# Patient Record
Sex: Male | Born: 1980 | Race: White | Hispanic: No | Marital: Single | Smoking: Current every day smoker
Health system: Southern US, Community
[De-identification: ages and names within clinical notes are randomized; demographics above are authoritative.]

## PROBLEM LIST (undated history)

## (undated) DIAGNOSIS — N159 Renal tubulo-interstitial disease, unspecified: Secondary | ICD-10-CM

## (undated) DIAGNOSIS — Q676 Pectus excavatum: Secondary | ICD-10-CM

---

## 2003-08-16 ENCOUNTER — Emergency Department (HOSPITAL_COMMUNITY): Admission: EM | Admit: 2003-08-16 | Discharge: 2003-08-16 | Payer: Self-pay | Admitting: Emergency Medicine

## 2005-10-21 ENCOUNTER — Emergency Department (HOSPITAL_COMMUNITY): Admission: EM | Admit: 2005-10-21 | Discharge: 2005-10-22 | Payer: Self-pay | Admitting: Emergency Medicine

## 2005-10-28 ENCOUNTER — Emergency Department (HOSPITAL_COMMUNITY): Admission: EM | Admit: 2005-10-28 | Discharge: 2005-10-28 | Payer: Self-pay | Admitting: Emergency Medicine

## 2009-03-12 ENCOUNTER — Emergency Department (HOSPITAL_COMMUNITY): Admission: EM | Admit: 2009-03-12 | Discharge: 2009-03-12 | Payer: Self-pay | Admitting: Emergency Medicine

## 2012-02-08 ENCOUNTER — Encounter (HOSPITAL_COMMUNITY): Payer: Self-pay | Admitting: *Deleted

## 2012-02-08 ENCOUNTER — Emergency Department (HOSPITAL_COMMUNITY)
Admission: EM | Admit: 2012-02-08 | Discharge: 2012-02-08 | Disposition: A | Discharge status: Home / Self Care | Payer: Self-pay | Attending: Emergency Medicine | Admitting: Emergency Medicine

## 2012-02-08 DIAGNOSIS — F419 Anxiety disorder, unspecified: Secondary | ICD-10-CM

## 2012-02-08 DIAGNOSIS — F17200 Nicotine dependence, unspecified, uncomplicated: Secondary | ICD-10-CM | POA: Insufficient documentation

## 2012-02-08 DIAGNOSIS — N390 Urinary tract infection, site not specified: Secondary | ICD-10-CM

## 2012-02-08 DIAGNOSIS — M7918 Myalgia, other site: Secondary | ICD-10-CM

## 2012-02-08 DIAGNOSIS — F411 Generalized anxiety disorder: Secondary | ICD-10-CM | POA: Insufficient documentation

## 2012-02-08 DIAGNOSIS — IMO0001 Reserved for inherently not codable concepts without codable children: Secondary | ICD-10-CM | POA: Insufficient documentation

## 2012-02-08 DIAGNOSIS — R109 Unspecified abdominal pain: Secondary | ICD-10-CM

## 2012-02-08 LAB — URINE MICROSCOPIC-ADD ON

## 2012-02-08 LAB — URINALYSIS, ROUTINE W REFLEX MICROSCOPIC
Hgb urine dipstick: NEGATIVE
Specific Gravity, Urine: 1.036 — ABNORMAL HIGH (ref 1.005–1.030)
pH: 5.5 (ref 5.0–8.0)

## 2012-02-08 MED ORDER — CIPROFLOXACIN HCL 500 MG PO TABS: 500.0000 mg | ORAL_TABLET | Freq: Two times a day (BID) | ORAL | Status: DC

## 2012-02-08 MED ORDER — CYCLOBENZAPRINE HCL 5 MG PO TABS: 5.0000 mg | ORAL_TABLET | Freq: Three times a day (TID) | ORAL | Status: DC | PRN

## 2012-02-08 MED ORDER — PROMETHAZINE HCL 25 MG PO TABS: 25.0000 mg | ORAL_TABLET | Freq: Four times a day (QID) | ORAL | Status: DC | PRN

## 2012-02-08 NOTE — ED Notes (Signed)
[id number]> Expected date:<BR> Expected time:<BR> Means of arrival:<BR> Comments:<BR> Difficulty swallowing

## 2012-02-08 NOTE — ED Provider Notes (Signed)
History     CSN: [id number]  Arrival date & time 02/08/12  0747   First MD Initiated Contact with Patient 02/08/12 0901      Chief Complaint  Patient presents with  . Flank Pain    (Consider location/radiation/quality/duration/timing/severity/associated sxs/prior treatment) HPI  Patient reports about 5 weeks ago he helped a friend move and was picking up heavy boxes. He states about the same time he started having a pressure feeling in his left flank area. He states the pain is also sharp. He also states he's feeling knots in his back that he is extremely concerned about. He states turning to the left or laying flat on his back makes the pain worse, sitting up or putting pressure on the area makes it feel better as does tapping on it  and he indicates he takes his fist and beats on his left flank area. He denies urinary symptoms such as dysuria or frequency or hematuria. He also denies penile drip. He states he's had some nausea and vomiting off and on for the past 2 weeks. He denies any fever or chills. He denies having this problem before.  Patient also reports he has been experiencing anxiety for the past 2 years. He states he's been under a lot of stress and he "overanalyzes" everything. 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  He also states he's stressed over keeping his relationship intact with his girlfriend. He denies feeling depressed or having suicidal ideation. He has never been treated for anxiety in the past.   PCP none  History reviewed. No pertinent past medical history.  History reviewed. No pertinent past surgical history.  No family history on file.  History  Substance Use Topics  . Smoking status: Current Every Day Smoker -- 0.5 packs/day    Types: Cigarettes  . Smokeless tobacco: Not on file  . Alcohol Use: No   Self-employed doing car bodywork   Review of  Systems  All other systems reviewed and are negative.    Allergies  Review of patient's allergies indicates no known allergies.  Home Medications   Current Outpatient Rx  Name  Route  Sig  Dispense  Refill    BP 132/82  Pulse 104  Temp 98 F (36.7 C) (Oral)  Resp 18  SpO2 98%  Vital signs normal except tachycardia   Physical Exam  Nursing note and vitals reviewed. Constitutional: He is oriented to person, place, and time.  Non-toxic appearance. He does not appear ill. No distress.       Tall, thin, anxious  HENT:  Head: Normocephalic and atraumatic.  Right Ear: External ear normal.  Left Ear: External ear normal.  Nose: Nose normal. No mucosal edema or rhinorrhea.  Mouth/Throat: Oropharynx is clear and moist and mucous membranes are normal. No dental abscesses or uvula swelling.  Eyes: Conjunctivae normal and EOM are normal. Pupils are equal, round, and reactive to light.  Neck: Normal range of motion and full passive range of motion without pain. Neck supple.  Cardiovascular: Normal rate, regular rhythm and normal heart sounds.  Exam reveals no gallop and no friction rub.   No murmur heard. Pulmonary/Chest: Effort normal and breath sounds normal. No respiratory distress. He has no wheezes. He has no rhonchi. He has no rales. He exhibits no tenderness and no crepitus.  Abdominal: Soft. Normal appearance and bowel sounds are normal. He exhibits no distension. There  is no tenderness. There is no rebound and no guarding.  Musculoskeletal: Normal range of motion. He exhibits no edema and no tenderness.       Moves all extremities well.   Patient shows me the knots in his back which are actually his lumbar spine spinous processes and transverse processes. He is extremely anxious and worried about this, he was reassured several times that this was the bones of his spine however he is not convinced.  Patient has diffuse pain in his left flank area without pain to palpation. He  has good range of motion of his waist without apparent discomfort.  Neurological: He is alert and oriented to person, place, and time. He has normal strength. No cranial nerve deficit.  Skin: Skin is warm, dry and intact. No rash noted. No erythema. No pallor.  Psychiatric: His speech is normal and behavior is normal. His mood appears not anxious.       Appears anxious    ED Course  Procedures (including critical care time)  Pt will be referred to [facility] for his anxiety, he does not need inpatient psychiatric care at this time. Pt does not appear to be in a lot of pain at this time, he easily changes positions and is watching TV in NAD.   Results for orders placed during the hospital encounter of 02/08/12  URINALYSIS, ROUTINE W REFLEX MICROSCOPIC      Component Value Range   Color, Urine AMBER (*) YELLOW   APPearance CLEAR  CLEAR   Specific Gravity, Urine 1.036 (*) 1.005 - 1.030   pH 5.5  5.0 - 8.0   Glucose, UA NEGATIVE  NEGATIVE mg/dL   Hgb urine dipstick NEGATIVE  NEGATIVE   Bilirubin Urine SMALL (*) NEGATIVE   Ketones, ur TRACE (*) NEGATIVE mg/dL   Protein, ur NEGATIVE  NEGATIVE mg/dL   Urobilinogen, UA 1.0  0.0 - 1.0 mg/dL   Nitrite NEGATIVE  NEGATIVE   Leukocytes, UA SMALL (*) NEGATIVE  URINE MICROSCOPIC-ADD ON      Component Value Range   Squamous Epithelial / LPF RARE  RARE   WBC, UA 7-10  <3 WBC/hpf   RBC / HPF 0-2  <3 RBC/hpf   Bacteria, UA FEW (*) RARE   Urine-Other MUCOUS PRESENT     Laboratory interpretation all normal except pyuria    1. Left flank pain   2. UTI (lower urinary tract infection)   3. Musculoskeletal pain   4. Anxiety     New Prescriptions   CIPROFLOXACIN (CIPRO) 500 MG TABLET    Take 1 tablet (500 mg total) by mouth every 12 (twelve) hours.   CYCLOBENZAPRINE (FLEXERIL) 5 MG TABLET    Take 1 tablet (5 mg total) by mouth 3 (three) times daily as needed for muscle spasms.   PROMETHAZINE (PHENERGAN) 25 MG TABLET    Take 1 tablet (25 mg total)  by mouth every 6 (six) hours as needed for nausea.    Plan discharge  Devoria Albe, MD, FACEP   MDM          Ward Givens, MD 02/08/12 [phone]

## 2012-02-08 NOTE — ED Notes (Signed)
MD at bedside. 

## 2012-02-08 NOTE — ED Notes (Signed)
Pt reports intermittent L flank pain x 4 weeks-reports pain every morning.  States pain is relieved by "tapping on it."  Reports vomiting x 1 this am prior to coming in the ED.  Pt also reports hx of "bad anxiety" which is causing his neck to hurt

## 2012-02-09 LAB — URINE CULTURE

## 2012-05-03 ENCOUNTER — Emergency Department (HOSPITAL_COMMUNITY): Payer: Self-pay

## 2012-05-03 ENCOUNTER — Encounter (HOSPITAL_COMMUNITY): Payer: Self-pay | Admitting: *Deleted

## 2012-05-03 ENCOUNTER — Emergency Department (HOSPITAL_COMMUNITY)
Admission: EM | Admit: 2012-05-03 | Discharge: 2012-05-03 | Disposition: A | Discharge status: Home Health | Payer: Self-pay | Attending: Emergency Medicine | Admitting: Emergency Medicine

## 2012-05-03 DIAGNOSIS — R071 Chest pain on breathing: Secondary | ICD-10-CM | POA: Insufficient documentation

## 2012-05-03 DIAGNOSIS — R0789 Other chest pain: Secondary | ICD-10-CM

## 2012-05-03 DIAGNOSIS — R109 Unspecified abdominal pain: Secondary | ICD-10-CM

## 2012-05-03 DIAGNOSIS — R110 Nausea: Secondary | ICD-10-CM | POA: Insufficient documentation

## 2012-05-03 DIAGNOSIS — F17200 Nicotine dependence, unspecified, uncomplicated: Secondary | ICD-10-CM | POA: Insufficient documentation

## 2012-05-03 DIAGNOSIS — R1012 Left upper quadrant pain: Secondary | ICD-10-CM | POA: Insufficient documentation

## 2012-05-03 LAB — CBC WITH DIFFERENTIAL/PLATELET
Basophils Relative: 0 % (ref 0–1)
Eosinophils Absolute: 0.2 10*3/uL (ref 0.0–0.7)
Eosinophils Relative: 2 % (ref 0–5)
Hemoglobin: 15.4 g/dL (ref 13.0–17.0)
Lymphs Abs: 1.3 10*3/uL (ref 0.7–4.0)
MCH: 31.7 pg (ref 26.0–34.0)
MCHC: 35.2 g/dL (ref 30.0–36.0)
MCV: 89.9 fL (ref 78.0–100.0)
Monocytes Relative: 8 % (ref 3–12)
Neutro Abs: 6.7 10*3/uL (ref 1.7–7.7)
Platelets: 172 10*3/uL (ref 150–400)
RDW: 12.5 % (ref 11.5–15.5)

## 2012-05-03 LAB — COMPREHENSIVE METABOLIC PANEL
ALT: 25 U/L (ref 0–53)
AST: 26 U/L (ref 0–37)
Alkaline Phosphatase: 72 U/L (ref 39–117)
GFR calc non Af Amer: >90 mL/min (ref >90)
Total Bilirubin: 0.4 mg/dL (ref 0.3–1.2)

## 2012-05-03 LAB — URINALYSIS, ROUTINE W REFLEX MICROSCOPIC
Leukocytes, UA: NEGATIVE
Protein, ur: NEGATIVE mg/dL

## 2012-05-03 LAB — LIPASE, BLOOD: Lipase: 30 U/L (ref 11–59)

## 2012-05-03 MED ORDER — OXYCODONE-ACETAMINOPHEN 5-325 MG PO TABS
2.0000 | ORAL_TABLET | Freq: Once | ORAL | Status: AC
  Administered 2012-05-03: 2 via ORAL
  Filled 2012-05-03: qty 2

## 2012-05-03 MED ORDER — MELOXICAM 15 MG PO TABS: 15.0000 mg | ORAL_TABLET | Freq: Every day | ORAL | Status: DC

## 2012-05-03 NOTE — ED Provider Notes (Signed)
History     CSN: [id number]  Arrival date & time 05/03/12  4098   First MD Initiated Contact with Patient 05/03/12 [phone]      Chief Complaint  Patient presents with  . Flank Pain  . Abdominal Pain    (Consider location/radiation/quality/duration/timing/severity/associated sxs/prior treatment) HPI  32 year old male presents to the emergency department with a complaint of left flank, left chest and left upper quadrant abdominal pain.  Patient states it has been going on for approximately 6 months and he has been to the emergency department before for the same.  Patient states that over the past 3 weeks the pain has been worsening in intensity.  He's had some associated nausea but denies any vomiting or diarrhea.  Patient denies a history of kidney stone.  He states that he thinks his mother may have history of kidney stones.  Patient denies any urinary symptoms.  He denies any penile discharge. Patient denies any shortness of breath, pain that radiates into the left shoulder or jaw, any paresthesia of the upper extremities, or diaphoresis. He denies any symptoms of upper respiratory infection.   History reviewed. No pertinent past medical history.  History reviewed. No pertinent past surgical history.  No family history on file.  History  Substance Use Topics  . Smoking status: Current Every Day Smoker -- 0.50 packs/day    Types: Cigarettes  . Smokeless tobacco: Not on file  . Alcohol Use: No      Review of Systems  Constitutional: Negative for fever, chills, activity change, appetite change and unexpected weight change.  Respiratory: Negative for cough and shortness of breath.   Cardiovascular: Negative for chest pain and palpitations.  Gastrointestinal: Positive for abdominal pain (ruq). Negative for vomiting, diarrhea and constipation.  Genitourinary: Positive for flank pain. Negative for dysuria, urgency, frequency, discharge, penile swelling and penile pain.   Musculoskeletal: Negative for myalgias, joint swelling, arthralgias and gait problem.  Skin: Negative for rash.  Neurological: Negative for headaches.    Allergies  Review of patient's allergies indicates no known allergies.  Home Medications  No current outpatient prescriptions on file.  BP 126/91  Pulse 92  Temp(Src) 98 F (36.7 C)  Resp 16  SpO2 98%  Physical Exam  Nursing note and vitals reviewed. Constitutional: He appears well-developed and well-nourished. No distress.  Addendum male in no acute distress   HENT:  Head: Normocephalic and atraumatic.  Eyes: Conjunctivae are normal. No scleral icterus.  Neck: Normal range of motion. Neck supple.  Cardiovascular: Normal rate, regular rhythm and normal heart sounds.   Pulmonary/Chest: Effort normal and breath sounds normal. No respiratory distress.  Large indentation in the lower portion of the sternum consistent with pectus excavitum   Abdominal: Soft. There is tenderness (luq). Guarding: ruq to very deep palpation.  No CVA tenderness  Musculoskeletal: He exhibits no edema.  Neurological: He is alert.  Skin: Skin is warm and dry. He is not diaphoretic.    ED Course  Procedures (including critical care time)  Labs Reviewed  COMPREHENSIVE METABOLIC PANEL - Abnormal; Notable for the following:    Glucose, Bld 107 (*)    All other components within normal limits  URINALYSIS, ROUTINE W REFLEX MICROSCOPIC  CBC WITH DIFFERENTIAL  LIPASE, BLOOD   No results found.   No diagnosis found.    MDM  10:52 AM BP 126/91  Pulse 92  Temp(Src) 98 F (36.7 C)  Resp 16  SpO2 98% Patient with complaint of pain in theleft chest,  flank, and left upper quadrant of the abdomen.  He does have pectus excavatum.  And may be related to musculoskeletal issues arising from his chest wall deformity..will evaluate for kidney stone, pyelo, uti.  Low suspicion for MI, diverticuiltis, pna, . Work up pending.  Patient labs show mildly  elevated glucose.  I suspect acute phase reaction due to stress.  All else negative.  Patient is nontoxic, nonseptic appearing, in no apparent distress.  Patient's pain and other symptoms adequately managed in emergency department.    Labs,and vitals reviewed.  Patient does not meet the SIRS or Sepsis criteria. I do not feel that imaging is indicated at this time.  Patient's patin was reproducible with palpation of the muscular tissues.   On repeat exam patient does not have a surgical abdomin and there are nor peritoneal signs.  No indication of appendicitis, bowel obstruction, bowel perforation, cholecystitis, diverticulitis,.  Patient discharged home with symptomatic treatment and given strict instructions for follow-up with their primary care physician.  I have also discussed reasons to return immediately to the ER.  Patient expresses understanding and agrees with plan.      Arthor Captain, PA-C 05/05/12 [phone]

## 2012-05-03 NOTE — ED Notes (Signed)
Patient states left lower abdominal and flank pain x 2-3 weeks, 3 months for abdominal pain, patient states +n/v/d,

## 2012-05-07 NOTE — ED Provider Notes (Signed)
Medical screening examination/treatment/procedure(s) were performed by non-physician practitioner and as supervising physician I was immediately available for consultation/collaboration.   Michael Y. Ghim, MD 05/07/12 0030 

## 2012-07-31 ENCOUNTER — Emergency Department (HOSPITAL_COMMUNITY): Payer: No Typology Code available for payment source

## 2012-07-31 ENCOUNTER — Encounter (HOSPITAL_COMMUNITY): Payer: Self-pay

## 2012-07-31 ENCOUNTER — Emergency Department (HOSPITAL_COMMUNITY)
Admission: EM | Admit: 2012-07-31 | Discharge: 2012-07-31 | Disposition: A | Discharge status: Home / Self Care | Payer: No Typology Code available for payment source | Attending: Emergency Medicine | Admitting: Emergency Medicine

## 2012-07-31 DIAGNOSIS — IMO0002 Reserved for concepts with insufficient information to code with codable children: Secondary | ICD-10-CM | POA: Insufficient documentation

## 2012-07-31 DIAGNOSIS — T148XXA Other injury of unspecified body region, initial encounter: Secondary | ICD-10-CM

## 2012-07-31 DIAGNOSIS — F17200 Nicotine dependence, unspecified, uncomplicated: Secondary | ICD-10-CM | POA: Insufficient documentation

## 2012-07-31 DIAGNOSIS — Y92410 Unspecified street and highway as the place of occurrence of the external cause: Secondary | ICD-10-CM | POA: Insufficient documentation

## 2012-07-31 DIAGNOSIS — Z791 Long term (current) use of non-steroidal anti-inflammatories (NSAID): Secondary | ICD-10-CM | POA: Insufficient documentation

## 2012-07-31 DIAGNOSIS — Y9389 Activity, other specified: Secondary | ICD-10-CM | POA: Insufficient documentation

## 2012-07-31 DIAGNOSIS — S139XXA Sprain of joints and ligaments of unspecified parts of neck, initial encounter: Secondary | ICD-10-CM | POA: Insufficient documentation

## 2012-07-31 DIAGNOSIS — Z87448 Personal history of other diseases of urinary system: Secondary | ICD-10-CM | POA: Insufficient documentation

## 2012-07-31 HISTORY — DX: Renal tubulo-interstitial disease, unspecified: N15.9

## 2012-07-31 MED ORDER — HYDROCODONE-ACETAMINOPHEN 5-325 MG PO TABS
1.0000 | ORAL_TABLET | Freq: Once | ORAL | Status: AC
  Administered 2012-07-31: 1 via ORAL
  Filled 2012-07-31: qty 1

## 2012-07-31 MED ORDER — CYCLOBENZAPRINE HCL 5 MG PO TABS: 5.0000 mg | ORAL_TABLET | Freq: Three times a day (TID) | ORAL | Status: DC | PRN

## 2012-07-31 MED ORDER — NAPROXEN 500 MG PO TABS: 500.0000 mg | ORAL_TABLET | Freq: Two times a day (BID) | ORAL | Status: DC

## 2012-07-31 NOTE — ED Notes (Signed)
Patient transported to X-ray 
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
## 2012-07-31 NOTE — ED Notes (Signed)
Patient was educated not to drive, operate heavy machinery, or drink alcohol while taking narcotic medication.  
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
## 2013-03-10 ENCOUNTER — Encounter (HOSPITAL_COMMUNITY): Payer: Self-pay | Admitting: Emergency Medicine

## 2013-03-10 ENCOUNTER — Emergency Department (HOSPITAL_COMMUNITY)
Admission: EM | Admit: 2013-03-10 | Discharge: 2013-03-11 | Disposition: A | Discharge status: Home / Self Care | Payer: No Typology Code available for payment source | Attending: Emergency Medicine | Admitting: Emergency Medicine

## 2013-03-10 DIAGNOSIS — M545 Low back pain, unspecified: Secondary | ICD-10-CM | POA: Insufficient documentation

## 2013-03-10 DIAGNOSIS — Z87448 Personal history of other diseases of urinary system: Secondary | ICD-10-CM | POA: Insufficient documentation

## 2013-03-10 DIAGNOSIS — Q676 Pectus excavatum: Secondary | ICD-10-CM | POA: Insufficient documentation

## 2013-03-10 DIAGNOSIS — M549 Dorsalgia, unspecified: Secondary | ICD-10-CM

## 2013-03-10 DIAGNOSIS — F17200 Nicotine dependence, unspecified, uncomplicated: Secondary | ICD-10-CM | POA: Insufficient documentation

## 2013-03-10 DIAGNOSIS — R300 Dysuria: Secondary | ICD-10-CM | POA: Insufficient documentation

## 2013-03-10 LAB — URINALYSIS, ROUTINE W REFLEX MICROSCOPIC
BILIRUBIN URINE: NEGATIVE
GLUCOSE, UA: NEGATIVE mg/dL
Hgb urine dipstick: NEGATIVE
KETONES UR: NEGATIVE mg/dL
LEUKOCYTES UA: NEGATIVE
Nitrite: NEGATIVE
PROTEIN: NEGATIVE mg/dL
Specific Gravity, Urine: 1.027 (ref 1.005–1.030)
Urobilinogen, UA: 0.2 mg/dL (ref 0.0–1.0)
pH: 6 (ref 5.0–8.0)

## 2013-03-10 MED ORDER — HYDROCODONE-ACETAMINOPHEN 5-325 MG PO TABS
1.0000 | ORAL_TABLET | Freq: Once | ORAL | Status: AC
  Administered 2013-03-11: 1 via ORAL
  Filled 2013-03-10: qty 1

## 2013-03-10 NOTE — ED Notes (Signed)
Pt states he thinks he may have a kidney infection  Pt is c/o left flank pain that started 2 days ago  Pt is also c/o migraine headache

## 2013-03-10 NOTE — ED Provider Notes (Signed)
CSN: [id number]     Arrival date & time 03/10/13  2002 History   First MD Initiated Contact with Patient 03/10/13 2303     Chief Complaint  Patient presents with  . Flank Pain   (Consider location/radiation/quality/duration/timing/severity/associated sxs/prior Treatment) HPI Comments: DETRIC SCALISI is a 33 year-old male, presenting the Emergency Department with a chief complaint of Left flank pain for several months worsening over the past 4 days.  He reports sharp, constant, non-radiating pain. The patient reports aggravating factors are bending and movement.  He reports he has experienced dysuria for the past several days.  The patient denies testicular pain or swelling.  Denies scrotal swelling, denies penile discharge. He denies known exposure to STI, and reports using a condom with each sexual encounter.  The history is provided by the patient and medical records. No language interpreter was used.    Past Medical History  Diagnosis Date  . Renal infection    History reviewed. No pertinent past surgical history. History reviewed. No pertinent family history. History  Substance Use Topics  . Smoking status: Current Every Day Smoker -- 0.50 packs/day    Types: Cigarettes  . Smokeless tobacco: Not on file  . Alcohol Use: Yes     Comment: rare    Review of Systems  Constitutional: Negative for fever and chills.  Gastrointestinal: Negative for nausea, vomiting, abdominal pain, diarrhea, constipation and blood in stool.  Genitourinary: Positive for dysuria and flank pain. Negative for urgency, frequency, hematuria, decreased urine volume, discharge, penile swelling, scrotal swelling, difficulty urinating, genital sores, penile pain and testicular pain.  Musculoskeletal: Negative for back pain.    Allergies  Review of patient's allergies indicates no known allergies.  Home Medications   Current Outpatient Rx  Name  Route  Sig  Dispense  Refill  . cyclobenzaprine (FLEXERIL) 10  MG tablet   Oral   Take 1 tablet (10 mg total) by mouth at bedtime.   20 tablet   0   . HYDROcodone-acetaminophen (NORCO/VICODIN) 5-325 MG per tablet   Oral   Take 1 tablet by mouth every 4 (four) hours as needed. With meals   15 tablet   0    BP 118/75  Pulse 92  Temp(Src) 98.8 F (37.1 C) (Oral)  Resp 20  Ht 6\' 1"  (1.854 m)  Wt 150 lb (68.04 kg)  BMI 19.79 kg/m2  SpO2 97% Physical Exam  Nursing note and vitals reviewed. Constitutional: He is oriented to person, place, and time. He appears well-developed and well-nourished. No distress.  HENT:  Head: Normocephalic and atraumatic.  Eyes: No scleral icterus.  Neck: Neck supple.  Cardiovascular: Normal rate and regular rhythm.   Pulmonary/Chest: Effort normal and breath sounds normal. He has no wheezes. He has no rales.  pectus excavatum  Abdominal: Soft. Bowel sounds are normal. He exhibits no distension and no mass. There is no tenderness. There is no rebound, no guarding and no CVA tenderness.  Musculoskeletal:       Lumbar back: He exhibits tenderness. He exhibits no swelling and no edema.       Back:  No midline C-spine, T-spine, or L-spine tenderness with no step-offs, crepitus, or deformities noted.    Neurological: He is alert and oriented to person, place, and time.  Skin: Skin is dry. He is not diaphoretic.  Psychiatric: He has a normal mood and affect. His behavior is normal.    ED Course  Procedures (including critical care time) Labs Review Labs Reviewed  URINALYSIS, ROUTINE W REFLEX MICROSCOPIC - Abnormal; Notable for the following:    APPearance TURBID (*)    All other components within normal limits  URINE CULTURE  URINE MICROSCOPIC-ADD ON   Imaging Review No results found.  EKG Interpretation   None       MDM   1. Back pain   2. Dysuria    Pt with a 1 year history of left back/flank pain, worsened over past 4 days.  PE without CVA tenderness.  Likely Muscular strain. Complains of  dysuria, will evaluate for infection, GC/Chlamydia for possible STI.  Patient declines GC/Chlamydia at this time, advises follow up with PCP or health department.  Urine culture sent.  Discussed patient history, condition, and labs with Dr. Rhunette CroftNanavati who agrees the patient can be evaluated as an out-pt. Discussed lab results and treatment plan with the patient for likely muscle strain. Return precautions given. Reports understanding and no other concerns at this time.  Patient is stable for discharge at this time. Meds given in ED:  Medications  HYDROcodone-acetaminophen (NORCO/VICODIN) 5-325 MG per tablet 1 tablet (1 tablet Oral Given 03/11/13 0035)    New Prescriptions   CYCLOBENZAPRINE (FLEXERIL) 10 MG TABLET    Take 1 tablet (10 mg total) by mouth at bedtime.   HYDROCODONE-ACETAMINOPHEN (NORCO/VICODIN) 5-325 MG PER TABLET    Take 1 tablet by mouth every 4 (four) hours as needed. With meals      Clabe SealLauren M Hakeem Frazzini, PA-C 03/11/13 1610

## 2013-03-11 LAB — URINE MICROSCOPIC-ADD ON

## 2013-03-11 MED ORDER — CYCLOBENZAPRINE HCL 10 MG PO TABS: 10.0000 mg | ORAL_TABLET | Freq: Every day | ORAL | Status: DC

## 2013-03-11 MED ORDER — HYDROCODONE-ACETAMINOPHEN 5-325 MG PO TABS: 1.0000 | ORAL_TABLET | ORAL | Status: DC | PRN

## 2013-03-11 NOTE — Discharge Instructions (Signed)
Call for a follow up appointment with a Family or Primary Care Provider.  You can go to the health care department for STI testing. Return if Symptoms worsen.   Take medication as prescribed.   Emergency Department Resource Guide 1) Find a Doctor and Pay Out of Pocket Although you won't have to find out who is covered by your insurance plan, it is a good idea to ask around and get recommendations. You will then need to call the office and see if the doctor you have chosen will accept you as a new patient and what types of options they offer for patients who are self-pay. Some doctors offer discounts or will set up payment plans for their patients who do not have insurance, but you will need to ask so you aren't surprised when you get to your appointment.  2) Contact Your Local Health Department Not all health departments have doctors that can see patients for sick visits, but many do, so it is worth a call to see if yours does. If you don't know where your local health department is, you can check in your phone book. The CDC also has a tool to help you locate your state's health department, and many state websites also have listings of all of their local health departments.  3) Find a Walk-in Clinic If your illness is not likely to be very severe or complicated, you may want to try a walk in clinic. These are popping up all over the country in pharmacies, drugstores, and shopping centers. They're usually staffed by nurse practitioners or physician assistants that have been trained to treat common illnesses and complaints. They're usually fairly quick and inexpensive. However, if you have serious medical issues or chronic medical problems, these are probably not your best option.  No Primary Care Doctor: - Call Health Connect at  [phone] - they can help you locate a primary care doctor that  accepts your insurance, provides certain services, etc. - Physician Referral Service-  [phone]  Chronic Pain Problems: Organization         Address  Phone   Notes  [facility]  [phone] Patients need to be referred by their primary care doctor.   Medication Assistance: Organization         Address  Phone   Notes  [facility] [address] [place], [place] [address] [phone] --Must be a resident of [place] -- Must have NO insurance coverage whatsoever (no Medicaid/ Medicare, etc.) -- The pt. MUST have a primary care doctor that directs their care regularly and follows them in the community   [facility]ist  [phone]   [employer]  [phone]    Agencies that provide inexpensive medical care: Organization         Address  Phone   Notes  [facility]  [phone]) [phone]   [facility]    [phone]   [facility] [address] [place], [place] [address] [phone]   [facility] [address], [place] [phone]) [phone]   [facility]anned [facility]    [phone]   [facility]    [phone]   [facility]  [address], [place] Phone:  [phone], Fax:  [phone] Hours of Operation:  9 am - 6 pm, M-F.  Also accepts Medicaid/Medicare and self-pay.  [facility]  [address], [place] Phone: [phone], Fax: [phone]. Hours of Operation:  8:30 am - 5:30 pm, M-F.  Also accepts Medicaid and self-pay.  [facility] [place] [address], [place] Phone: [phone]   [place], [place], [place] [phone], Ext. [phone] Mondays & Thursdays: 7-9 AM.  First 15 patients are seen on a first come, first serve basis.    [place] Providers:  Organization         Address  Phone   Notes  [facility] [address],  [place] [phone] Also accepts self-pay patients.  [facility] [address], [place]  [phone]   [place], [address], [place] [phone]   [facility] [address] [place] [phone]   Lucianne Lei [address], [place]   [phone] Only accepts [place] Access [place] patients after they have their name applied to their card.   Self-Pay (no insurance) in [facility]:  Organization         Address  Phone   Notes  Sickle Cell Patients, [facility] [place] [phone]   [facility] [place] [phone]   [facility] [place]  [place], [place], [place] [phone]   [facility]/Dr. Osei-Bonsu  [address], [place] or [address], [place] [phone] Phone number for both [place] and [place] locations is the same.  [facility] [address], [place] [phone]   [facility] [address] [place] or [address] [phone]   [facility] [address], [place] [phone], phone; [phone], fax Sees patients 1st and 3rd Saturday of every month.  Must not qualify for public or private insurance (i.e. Medicaid, Medicare, [place] Health Choice, Veterans' Benefits)  Household income should be no more than 200% of the poverty level The clinic cannot treat you if you are pregnant or think you are pregnant  Sexually transmitted diseases are not treated at the clinic.    Dental Care: Organization         Address  Phone  Notes  [facility] Department of [facility] [phone] Accepts children up to age 29 who are enrolled in  [place] or [place]; pregnant women with a Medicaid card; and children who have applied for Medicaid or [place] Health Choice, but were declined, whose parents can pay a reduced fee at time of service.  [facility] of [facility]  [address], [place] [phone] Accepts children up to age 36 who are enrolled in [place] or [place]; pregnant women with a Medicaid card; and children who have applied for Medicaid or [place] Health Choice, but were declined, whose parents can pay a reduced fee at time of service.  [facility]  [place] [phone] Patients are seen by appointment only. Walk-ins are not accepted. [place] will see patients 66 years of age and older. Monday - Tuesday (8am-5pm) Most Wednesdays (8:30-5pm) $30 per visit, cash only  [facility]  [address], [facility] [phone] Patients are seen by appointment only. Walk-ins are not accepted. [place] will see patients 55 years of age and older. One Wednesday Evening (Monthly: Volunteer Based).  $30 per visit, cash only  [place]  [phone] for adults; Children under age 16, call Graduate Pediatric Dentistry at [phone]. Children aged 15-14, please call [phone] to request a pediatric application.  Dental services are provided in all areas of dental care including fillings, crowns and bridges, complete and partial dentures, implants, gum treatment, root canals, and extractions. Preventive care is also provided. Treatment is provided to both adults and children. Patients are selected via a lottery and there is often a waiting list.   [facility] [address] [place]  [phone] [email or web address]   [facility] [address] [place], [place] [phone], Ext. [phone] Second and Fourth Thursday of each month, opens at 6:30  AM; Clinic ends at 9 AM.  Patients are seen on a first-come first-served basis, and a limited number are seen during each clinic.   [facility]  [address] Hillard Danker [place], [place] [phone]   Eligibility Requirements You must have lived in [place], [place], or [place] counties for at least the last three months.   You cannot be eligible for state or federal sponsored [employer], including [employer], [place], or [employer].   You generally cannot be eligible for healthcare insurance through your employer.    How to apply: Eligibility screenings are held every Tuesday and Wednesday afternoon from 1:00 pm until 4:00 pm. You do not need an appointment for the interview!  [facility] [address] [place], [place]   [employer]  [place] Department  [place]  [phone]    Behavioral Health Resources in the Community: Intensive Outpatient Programs Organization         Address  Phone  Notes  [facility] [place]. [address], [place], [place] [phone]   [facility] [address], [place], [employer]   ADS: [facility] Drug S[facility] [address], [place], [place]   [place] [address]  [place], [place] or [phone]   Substance Abuse Resources Organization         Address  Phone  Notes  Alcohol and Drug Services  [phone]   [place]  [phone]   [facility] [place]   Chinita Pester  [phone]   Residential & Outpatient Substance Abuse Program  [phone]   Psychological Services Organization         Address  Phone  Notes  [facility] [place]  [place]  [phone]   [place] [address] [place] [phone] or  [phone]    Mobile Crisis Teams Organization         Address  Phone  Notes  [facility], Mobile Crisis Care Unit  [phone]   [facility]  [address] [place], [place]   [place] [address] [place] [phone]    Self-Help/Support Groups Organization         Address  Phone             Notes  [facility] [place] - variety of support groups  [place] Call for more information  Narcotics Anonymous (NA), [facility]aring [facility] [address], [employer]   2 meetings at this location   Special educational needs teacher         Address  Phone  Notes  [facility] [place],    [place]  [phone]   [facility]  [address] [place] [phone], [place], [place]   [place], [place] [phone] Admissions: 8am-3pm M-F  Incentives Substance [place] [address]    [place], [place] [phone]   [facility] [address] [place], [place], [place]   [facility] [address]  [place], [place]   [facility] - Intensive Outpatient [address], [place], [place]   [facility] ([place].,  [place], [place] [phone] or [phone]   [facility] ([facility]) [address], [place], [place] Accepts Medicaid  [facility] [address],  [place] [phone] Substance Abuse/Addiction Treatment   [facility] Organization         Address  Phone  Notes  [facility]  [phone]   Domenic Schwab, PhD [address] Arlis Porta [place], [place]   [phone] or [phone]   [place], [place] [phone]   [facility] [address]  [place], [place] [phone] Insurance/Medicaid/sponsorship through [facility] and Families [address], [address] [place]                                    [place], [place] [phone] [place] [address], [place] [phone]    Dr. Adele Schilder  [phone]   [facility] Dept. 1) [address], [place] 2) [place] 3)  [place] [address], [place] [phone]  [phone]   [place] [phone] or [phone] (After Hours)

## 2013-03-12 LAB — URINE CULTURE
CULTURE: NO GROWTH
Colony Count: NO GROWTH

## 2013-03-12 NOTE — ED Provider Notes (Signed)
Medical screening examination/treatment/procedure(s) were performed by non-physician practitioner and as supervising physician I was immediately available for consultation/collaboration.  EKG Interpretation   None        Derwood KaplanAnkit Donesha Wallander, MD 03/12/13 0050

## 2015-04-20 ENCOUNTER — Encounter (HOSPITAL_COMMUNITY): Payer: Self-pay | Admitting: Emergency Medicine

## 2015-04-20 ENCOUNTER — Emergency Department (HOSPITAL_COMMUNITY)
Admission: EM | Admit: 2015-04-20 | Discharge: 2015-04-20 | Disposition: A | Discharge status: Home / Self Care | Payer: No Typology Code available for payment source | Attending: Emergency Medicine | Admitting: Emergency Medicine

## 2015-04-20 DIAGNOSIS — Y998 Other external cause status: Secondary | ICD-10-CM | POA: Insufficient documentation

## 2015-04-20 DIAGNOSIS — S0502XA Injury of conjunctiva and corneal abrasion without foreign body, left eye, initial encounter: Secondary | ICD-10-CM | POA: Insufficient documentation

## 2015-04-20 DIAGNOSIS — Y9389 Activity, other specified: Secondary | ICD-10-CM | POA: Insufficient documentation

## 2015-04-20 DIAGNOSIS — Y9289 Other specified places as the place of occurrence of the external cause: Secondary | ICD-10-CM | POA: Insufficient documentation

## 2015-04-20 DIAGNOSIS — T2692XA Corrosion of left eye and adnexa, part unspecified, initial encounter: Secondary | ICD-10-CM

## 2015-04-20 DIAGNOSIS — X58XXXA Exposure to other specified factors, initial encounter: Secondary | ICD-10-CM | POA: Insufficient documentation

## 2015-04-20 DIAGNOSIS — F17210 Nicotine dependence, cigarettes, uncomplicated: Secondary | ICD-10-CM | POA: Insufficient documentation

## 2015-04-20 DIAGNOSIS — Z87448 Personal history of other diseases of urinary system: Secondary | ICD-10-CM | POA: Insufficient documentation

## 2015-04-20 MED ORDER — TETRACAINE HCL 0.5 % OP SOLN
1.0000 [drp] | Freq: Once | OPHTHALMIC | Status: AC
  Administered 2015-04-20: 1 [drp] via OPHTHALMIC
  Filled 2015-04-20: qty 2

## 2015-04-20 MED ORDER — TOBRAMYCIN 0.3 % OP SOLN
1.0000 [drp] | OPHTHALMIC | Status: DC
  Administered 2015-04-20: 1 [drp] via OPHTHALMIC
  Filled 2015-04-20: qty 5

## 2015-04-20 MED ORDER — ARTIFICIAL TEARS OP OINT
1.0000 "application " | TOPICAL_OINTMENT | OPHTHALMIC | Status: DC | PRN
  Administered 2015-04-20: 1 via OPHTHALMIC
  Filled 2015-04-20: qty 3.5

## 2015-04-20 MED ORDER — FLUORESCEIN SODIUM 1 MG OP STRP
1.0000 | ORAL_STRIP | Freq: Once | OPHTHALMIC | Status: AC
  Administered 2015-04-20: 1 via OPHTHALMIC
  Filled 2015-04-20: qty 1

## 2015-04-20 NOTE — ED Notes (Signed)
Pt presents to ED after getting transmission fluid in his left eye "three days ago".  Pt sts he has been waking up to his eye being caked closed, he's been having blurry vision, and some burning.

## 2015-04-20 NOTE — Discharge Instructions (Signed)
Apply the drops to the eye 1 drop 4 times a day for 1 week Apply the lubricant ointment to the eye every 3-4 hours as needed. Follow up withCorneal Abrasion The cornea is the clear covering at the front and center of the eye. When looking at the colored portion of the eye (iris), you are looking through the cornea. This very thin tissue is made up of many layers. The surface layer is a single layer of cells (corneal epithelium) and is one of the most sensitive tissues in the body. If a scratch or injury causes the corneal epithelium to come off, it is called a corneal abrasion. If the injury extends to the tissues below the epithelium, the condition is called a corneal ulcer. CAUSES   Scratches.  Trauma.  Foreign body in the eye. Some people have recurrences of abrasions in the area of the original injury even after it has healed (recurrent erosion syndrome). Recurrent erosion syndrome generally improves and goes away with time. SYMPTOMS   Eye pain.  Difficulty or inability to keep the injured eye open.  The eye becomes very sensitive to light.  Recurrent erosions tend to happen suddenly, first thing in the morning, usually after waking up and opening the eye. DIAGNOSIS  Your health care provider can diagnose a corneal abrasion during an eye exam. Dye is usually placed in the eye using a drop or a small paper strip moistened by your tears. When the eye is examined with a special light, the abrasion shows up clearly because of the dye. TREATMENT   Small abrasions may be treated with antibiotic drops or ointment alone.  A pressure patch may be put over the eye. If this is done, follow your doctor's instructions for when to remove the patch. Do not drive or use machines while the eye patch is on. Judging distances is hard to do with a patch on. If the abrasion becomes infected and spreads to the deeper tissues of the cornea, a corneal ulcer can result. This is serious because it can cause  corneal scarring. Corneal scars interfere with light passing through the cornea and cause a loss of vision in the involved eye. HOME CARE INSTRUCTIONS  Use medicine or ointment as directed. Only take over-the-counter or prescription medicines for pain, discomfort, or fever as directed by your health care provider.  Do not drive or operate machinery if your eye is patched. Your ability to judge distances is impaired.  If your health care provider has given you a follow-up appointment, it is very important to keep that appointment. Not keeping the appointment could result in a severe eye infection or permanent loss of vision. If there is any problem keeping the appointment, let your health care provider know. SEEK MEDICAL CARE IF:   You have pain, light sensitivity, and a scratchy feeling in one eye or both eyes.  Your pressure patch keeps loosening up, and you can blink your eye under the patch after treatment.  Any kind of discharge develops from the eye after treatment or if the lids stick together in the morning.  You have the same symptoms in the morning as you did with the original abrasion days, weeks, or months after the abrasion healed.   This information is not intended to replace advice given to you by your health care provider. Make sure you discuss any questions you have with your health care provider.   Document Released: 02/04/2000 Document Revised: 10/28/2014 Document Reviewed: 10/14/2012 Elsevier Interactive Patient Education  2016 Elsevier Inc.  Dr. Randon Goldsmith if your symptoms worsen or do not improve

## 2015-04-20 NOTE — ED Notes (Signed)
Pt stated he got transmission fluid in his left eye yesterday.  Slight redness noted.  No distress noted.

## 2015-04-20 NOTE — ED Provider Notes (Signed)
CSN: [id number]     Arrival date & time 04/20/15  1825 History  By signing my name below, I, Octavia Heir, attest that this documentation has been prepared under the direction and in the presence of Arthor Captain, PA-C. Electronically Signed: Octavia Heir, ED Scribe. 04/20/2015. 7:18 PM.    Chief Complaint  Patient presents with  . Eye Pain      The history is provided by the patient. No language interpreter was used.   HPI Comments: Mathew Byrd is a 35 y.o. male who presents to the Emergency Department complaining of a constant, gradual worsening left eye pain with associated eye dryness onset 3 days ago. He states today he began to have blurry vision for about one hour. Pt says he got transmission fluid in his eye about 3 days ago. After the incident occurred, pt reports his eye kept watering after cleaning it out. He reports that two days ago upon awakening, his eye was swollen and "stuck together" and has continued until today. Pt states his eye feels extremely dry and he when he puts the eyedrops it, it "soaks it up". He has been putting OTC lubricating eye drops in his eye to alleviate his symptoms with no relief. Pt denies any other symptoms. He does not wear contacts.    Past Medical History  Diagnosis Date  . Renal infection    History reviewed. No pertinent past surgical history. History reviewed. No pertinent family history. Social History  Substance Use Topics  . Smoking status: Current Every Day Smoker -- 0.50 packs/day    Types: Cigarettes  . Smokeless tobacco: None  . Alcohol Use: Yes     Comment: rare    Review of Systems  Eyes: Positive for pain, redness and visual disturbance.  All other systems reviewed and are negative.     Allergies  Review of patient's allergies indicates no known allergies.  Home Medications   Prior to Admission medications   Medication Sig Start Date End Date Taking? Authorizing Provider  cyclobenzaprine (FLEXERIL) 10 MG  tablet Take 1 tablet (10 mg total) by mouth at bedtime. 03/11/13   Mellody Drown, PA-C  HYDROcodone-acetaminophen (NORCO/VICODIN) 5-325 MG per tablet Take 1 tablet by mouth every 4 (four) hours as needed. With meals 03/11/13   Mellody Drown, PA-C   Triage vitals: BP 123/77 mmHg  Pulse 100  Temp(Src) 97.6 F (36.4 C) (Oral)  Resp 16  Ht  (1.854 m)  Wt 155 lb (70.308 kg)  BMI 20.45 kg/m2  SpO2 100% Physical Exam  Constitutional: He appears well-developed and well-nourished. No distress.  HENT:  Head: Normocephalic and atraumatic.  Eyes: Conjunctivae are normal. No scleral icterus.  Eye pressure: 15 on right, 17 on left; pH on both eyes are 7. Left eye is mildly injected, fluorescence uptake on conjunctiva on medial inferior region of the eye, no corneal involvement  Neck: Normal range of motion. Neck supple.  Cardiovascular: Normal rate, regular rhythm and normal heart sounds.   Pulmonary/Chest: Effort normal and breath sounds normal. No respiratory distress.  Abdominal: Soft. There is no tenderness.  Musculoskeletal: He exhibits no edema.  Neurological: He is alert.  Skin: Skin is warm and dry. He is not diaphoretic.  Psychiatric: His behavior is normal.  Nursing note and vitals reviewed.   ED Course  Procedures  DIAGNOSTIC STUDIES: Oxygen Saturation is 100% on RA, normal by my interpretation.  COORDINATION OF CARE:  7:00 PM Discussed treatment plan which includes numb and stain the  eye with pt at bedside and pt agreed to plan. Pt will be given antibiotic eye drops and eye ointment.  Labs Review Labs Reviewed - No data to display  Imaging Review No results found. I have personally reviewed and evaluated these images and lab results as part of my medical decision-making.   EKG Interpretation None      MDM   Final diagnoses:  Chemical injury of eye, left, initial encounter   Normal pH Corneal abrasion  Pt with corneal abrasion on PE. Eye irrigated w NS, no  evidence of FB.  No change in vision, acuity equal bilaterally.  Pt is not a contact lens wearer.  Exam non-concerning for orbital cellulitis, hyphema, corneal ulcers. Patient will be discharged home with erythromycin.   Patient understands to follow up with ophthalmology, & to return to ER if new symptoms develop including change in vision, purulent drainage, or entrapment. F/U with ophthalmology.  Medications  tetracaine (PONTOCAINE) 0.5 % ophthalmic solution 1 drop (1 drop Both Eyes Given 04/20/15 1902)  fluorescein ophthalmic strip 1 strip (1 strip Left Eye Given 04/20/15 1902)    I personally performed the services described in this documentation, which was scribed in my presence. The recorded information has been reviewed and is accurate.       Arthor Captain, PA-C 04/25/15 2003  Cathren Laine, MD 04/30/15 0730

## 2015-04-20 NOTE — ED Notes (Signed)
Patient able to ambulate independently  

## 2015-11-22 ENCOUNTER — Encounter (HOSPITAL_COMMUNITY): Payer: Self-pay | Admitting: Emergency Medicine

## 2015-11-22 ENCOUNTER — Emergency Department (HOSPITAL_COMMUNITY)
Admission: EM | Admit: 2015-11-22 | Discharge: 2015-11-23 | Disposition: A | Discharge status: Home / Self Care | Payer: Self-pay | Attending: Emergency Medicine | Admitting: Emergency Medicine

## 2015-11-22 DIAGNOSIS — F17210 Nicotine dependence, cigarettes, uncomplicated: Secondary | ICD-10-CM | POA: Insufficient documentation

## 2015-11-22 DIAGNOSIS — K644 Residual hemorrhoidal skin tags: Secondary | ICD-10-CM

## 2015-11-22 DIAGNOSIS — K921 Melena: Secondary | ICD-10-CM

## 2015-11-22 DIAGNOSIS — R109 Unspecified abdominal pain: Secondary | ICD-10-CM

## 2015-11-22 DIAGNOSIS — Z79899 Other long term (current) drug therapy: Secondary | ICD-10-CM | POA: Insufficient documentation

## 2015-11-22 LAB — COMPREHENSIVE METABOLIC PANEL
ALBUMIN: 4.9 g/dL (ref 3.5–5.0)
ALK PHOS: 70 U/L (ref 38–126)
ALT: 19 U/L (ref 17–63)
AST: 24 U/L (ref 15–41)
Anion gap: 6 (ref 5–15)
BILIRUBIN TOTAL: 1 mg/dL (ref 0.3–1.2)
BUN: 21 mg/dL — AB (ref 6–20)
CALCIUM: 9.9 mg/dL (ref 8.9–10.3)
CO2: 29 mmol/L (ref 22–32)
CREATININE: 0.82 mg/dL (ref 0.61–1.24)
Chloride: 104 mmol/L (ref 101–111)
GFR calc Af Amer: >60 mL/min (ref >60)
GFR calc non Af Amer: >60 mL/min (ref >60)
GLUCOSE: 97 mg/dL (ref 65–99)
Potassium: 4.5 mmol/L (ref 3.5–5.1)
Sodium: 139 mmol/L (ref 135–145)
TOTAL PROTEIN: 7.9 g/dL (ref 6.5–8.1)

## 2015-11-22 LAB — URINALYSIS, ROUTINE W REFLEX MICROSCOPIC
BILIRUBIN URINE: NEGATIVE
Glucose, UA: NEGATIVE mg/dL
Hgb urine dipstick: NEGATIVE
KETONES UR: NEGATIVE mg/dL
Leukocytes, UA: NEGATIVE
NITRITE: NEGATIVE
PH: 6 (ref 5.0–8.0)
Protein, ur: NEGATIVE mg/dL
Specific Gravity, Urine: 1.029 (ref 1.005–1.030)

## 2015-11-22 LAB — CBC
HCT: 47.2 % (ref 39.0–52.0)
Hemoglobin: 16.3 g/dL (ref 13.0–17.0)
MCH: 32.3 pg (ref 26.0–34.0)
MCHC: 34.5 g/dL (ref 30.0–36.0)
MCV: 93.5 fL (ref 78.0–100.0)
PLATELETS: 221 10*3/uL (ref 150–400)
RBC: 5.05 MIL/uL (ref 4.22–5.81)
RDW: 12.2 % (ref 11.5–15.5)
WBC: 8.9 10*3/uL (ref 4.0–10.5)

## 2015-11-22 LAB — LIPASE, BLOOD: Lipase: 20 U/L (ref 11–51)

## 2015-11-22 LAB — POC OCCULT BLOOD, ED: Fecal Occult Bld: POSITIVE — AB

## 2015-11-22 NOTE — ED Triage Notes (Signed)
Pt states that he has had abdominal pain x 1 week and has had intermittent light rectal bleeding x 1 year. Alert and oriented. Nausea.

## 2015-11-22 NOTE — ED Provider Notes (Signed)
[facility]-EMERGENCY DEPT Provider Note   CSN: [id number] Arrival date & time: 11/22/15  1849 By signing my name below, I, Mathew Byrd, attest that this documentation has been prepared under the direction and in the presence of non-physician practitioner, Antony Madura, PA-C Electronically Signed: Levon Byrd, Scribe. 11/22/2015. 10:34 PM.    History   Chief Complaint Chief Complaint  Patient presents with  . Abdominal Pain  . Rectal Bleeding   HPI Mathew Byrd is a 35 y.o. male who presents to the Emergency Department complaining of intermittent, sharp RUQ pain onset one week ago. 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 He notes worsening rectal bleeding which began one year ago, but has worsened in the last 2-3 months. He states the bleeding is heaviest when he is having abdominal pain. Blood is present in the stool and when wiping. Pt also notes associated nausea and generalized burning abdominal pain.  He states he has had multiple kidney infections in the past, but has never seen a urologist. Last infection was 3-4 years ago. Pt is not currently followed by a PCP. He endorses occasional alcohol use. He denies melena, fever, vomiting, or any urinary symptoms. No hx of abdominal surgeries.   The history is provided by the patient. No language interpreter was used.    Past Medical History:  Diagnosis Date  . Renal infection     There are no active problems to display for this patient.  History reviewed. No pertinent surgical history.   Home Medications    Prior to Admission medications   Medication Sig Start Date End Date Taking? Authorizing Provider  cyclobenzaprine (FLEXERIL) 10 MG tablet Take 1 tablet (10 mg total) by mouth at bedtime. Patient not taking: Reported on 11/22/2015 03/11/13   Mellody Drown, PA-C  HYDROcodone-acetaminophen (NORCO/VICODIN) 5-325 MG per tablet Take 1 tablet by mouth every 4 (four) hours as needed. With meals Patient not taking:  Reported on 11/22/2015 03/11/13   Mellody Drown, PA-C  omeprazole (PRILOSEC) 20 MG capsule Take 1 capsule (20 mg total) by mouth daily. 11/23/15   Antony Madura, PA-C    Family History History reviewed. No pertinent family history.  Social History Social History  Substance Use Topics  . Smoking status: Current Every Day Smoker    Packs/day: 0.50    Types: Cigarettes  . Smokeless tobacco: Not on file  . Alcohol use Yes     Comment: rare    Allergies   Review of patient's allergies indicates no known allergies.  Review of Systems Review of Systems 10 systems reviewed and all are negative for acute change except as noted in the HPI.   Physical Exam Updated Vital Signs BP 95/59   Pulse 60   Temp 98.2 F (36.8 C) (Oral)   Resp 18   SpO2 100%   Physical Exam  Constitutional: He is oriented to person, place, and time. He appears well-developed and well-nourished. No distress.  Nontoxic appearing and in no distress.  HENT:  Head: Normocephalic and atraumatic.  Eyes: Conjunctivae and EOM are normal. No scleral icterus.  Neck: Normal range of motion.  Cardiovascular: Normal rate, regular rhythm and intact distal pulses.   Pulmonary/Chest: Effort normal. No respiratory distress. He has no wheezes. He has no rales.  Respirations even and unlabored.  Lungs clear to auscultation bilaterally.  Abdominal: Soft. He exhibits no distension and no mass. There is tenderness. There is no guarding.  Soft, nondistended abdomen with TTP in the RUQ and mildly  in the epigastrium. Negative Murphy's sign. No peritoneal signs or masses. Normoactive bowel sounds.  Genitourinary:  Genitourinary Comments: Chaperoned DRE significant for external hemorrhoids.  No evidence of thrombosed hemorrhoids.  There is normal rectal tone.  No anal fissure.  Brown stool appreciated on exam.  There is no melena or hematochezia present.  Musculoskeletal: Normal range of motion.  No bilateral lower extremity edema.    Neurological: He is alert and oriented to person, place, and time. He exhibits normal muscle tone. Coordination normal.  Skin: Skin is warm and dry. No rash noted. He is not diaphoretic. No erythema. No pallor.  Psychiatric: He has a normal mood and affect. His behavior is normal.  Nursing note and vitals reviewed.   ED Treatments / Results  DIAGNOSTIC STUDIES:  Oxygen Saturation is 98% on RA, normal by my interpretation.    COORDINATION OF CARE:  11:02 PM  Discussed treatment plan with pt at bedside and pt agreed to plan.   Labs (all labs ordered are listed, but only abnormal results are displayed) Labs Reviewed  COMPREHENSIVE METABOLIC PANEL - Abnormal; Notable for the following:       Result Value   BUN 21 (*)    All other components within normal limits  POC OCCULT BLOOD, ED - Abnormal; Notable for the following:    Fecal Occult Bld POSITIVE (*)    All other components within normal limits  LIPASE, BLOOD  CBC  URINALYSIS, ROUTINE W REFLEX MICROSCOPIC (NOT AT [facility])    EKG  EKG Interpretation None      Radiology Ct Abdomen Pelvis W Contrast  Result Date: 11/23/2015 CLINICAL DATA:  Abdominal pain. Right upper quadrant pain for 1 week. Worsening rectal bleeding which began a year ago. Nausea. Multiple kidney infections. EXAM: CT ABDOMEN AND PELVIS WITH CONTRAST TECHNIQUE: Multidetector CT imaging of the abdomen and pelvis was performed using the standard protocol following bolus administration of intravenous contrast. CONTRAST:  ISOVUE-300 IOPAMIDOL (ISOVUE-300) INJECTION 61% COMPARISON:  None. FINDINGS: Lower chest: Pectus excavatum deformity.  Lung bases are clear. Hepatobiliary: No focal liver abnormality is seen. No gallstones, gallbladder wall thickening, or biliary dilatation. Pancreas: Unremarkable. No pancreatic ductal dilatation or surrounding inflammatory changes. Spleen: Normal in size without focal abnormality. Adrenals/Urinary Tract: Adrenal glands are  unremarkable. Kidneys are normal, without renal calculi, focal lesion, or hydronephrosis. Bladder is unremarkable. Stomach/Bowel: Stomach, small bowel, and colon are not abnormally distended. Scattered fluid and gas throughout the small bowel. Scattered stool throughout the colon. No discrete wall thickening is appreciated although under distention limits this evaluation. Vascular/Lymphatic: No significant vascular findings are present. No enlarged abdominal or pelvic lymph nodes. Filling defects are demonstrated in the common femoral and iliac veins bilaterally. This may be due to mixing of non-opacified blood but can't exclude venous thrombosis. Reproductive: Uterus and bilateral adnexa are unremarkable. Other: No abdominal wall hernia or abnormality. No abdominopelvic ascites. Musculoskeletal: No acute or significant osseous findings. IMPRESSION: No definite evidence for any acute process in the abdomen or pelvis. No evidence of bowel obstruction. Nonspecific filling defects in the common femoral and iliac veins may be due to mixing of non-opacified blood but can't exclude venous thrombosis. Electronically Signed   By: Burman Nieves M.D.   On: 11/23/2015 00:54    Procedures Procedures (including critical care time)  Medications Ordered in ED Medications  gi cocktail (Maalox,Lidocaine,Donnatal) (not administered)  iopamidol (ISOVUE-300) 61 % injection 100 mL (100 mLs Intravenous Contrast Given 11/23/15 0006)  Initial Impression / Assessment and Plan / ED Course  I have reviewed the triage vital signs and the nursing notes.  Pertinent labs & imaging results that were available during my care of the patient were reviewed by me and considered in my medical decision making (see chart for details).  Clinical Course    35 year old male presents to the emergency department for abdominal pain which has been fairly persistent over the past week.  He has not had any emesis associated with his  symptoms, though he does complain of some nausea.  Patient states that he has noted some hematochezia; however, this is not new for him, present for the past year.  He has no evidence of gross blood on digital rectal exam today.  There is no melena.  External hemorrhoids noted on chaperoned exam.  Patient is afebrile with reassuring laboratory workup.  He has no leukocytosis.  Liver and kidney function preserved.  Pancreas function is normal.  Given her reported tenderness and hematochezia, CT abdomen and pelvis obtained which shows no acute process in the abdomen or pelvis. No findings to suggest Crohn's colitis. Gallbladder unremarkable. There is question of filling defect vs thrombosis in the common femoral and iliac veins. Doubt thrombosis as patient with no lower extremity edema or know risk factors for this.  Given reassuring workup today, I believe the patient is appropriate for discharge.  He has been referred to a gastroenterologist for outpatient follow-up.  Plan is started on PPI as upper abdominal pain may be secondary to gastritis.  Return precautions discussed and provided.  Patient discharged in stable condition with no unaddressed concerns.   Final Clinical Impressions(s) / ED Diagnoses   Final diagnoses:  Abdominal pain, unspecified abdominal location  Hematochezia  External hemorrhoids    New Prescriptions New Prescriptions   OMEPRAZOLE (PRILOSEC) 20 MG CAPSULE    Take 1 capsule (20 mg total) by mouth daily.   I personally performed the services described in this documentation, which was scribed in my presence. The recorded information has been reviewed and is accurate.      Antony MaduraKelly Giovan Pinsky, PA-C 11/23/15 [id number]    Mancel BaleElliott Wentz, MD 11/23/15 2125

## 2015-11-23 ENCOUNTER — Emergency Department (HOSPITAL_COMMUNITY): Payer: Self-pay

## 2015-11-23 MED ORDER — GI COCKTAIL ~~LOC~~
30.0000 mL | Freq: Once | ORAL | Status: AC
  Administered 2015-11-23: 30 mL via ORAL
  Filled 2015-11-23: qty 30

## 2015-11-23 MED ORDER — IOPAMIDOL (ISOVUE-300) INJECTION 61%
100.0000 mL | Freq: Once | INTRAVENOUS | Status: AC | PRN
  Administered 2015-11-23: 100 mL via INTRAVENOUS

## 2015-11-23 MED ORDER — OMEPRAZOLE 20 MG PO CPDR: 20.0000 mg | DELAYED_RELEASE_CAPSULE | Freq: Every day | ORAL | 0 refills | Status: AC

## 2015-11-23 NOTE — ED Notes (Signed)
Patient transported to CT 

## 2017-09-19 IMAGING — CT CT ABD-PELV W/ CM
2 of 4 series · 15 of 46 positions shown, 17 images · IV contrast (iopamidol)
Comparison: None.

CLINICAL DATA: Abdominal pain. Right upper quadrant pain for 1
week. Worsening rectal bleeding which began a year ago. Nausea.
Multiple kidney infections.

EXAM:
CT ABDOMEN AND PELVIS WITH CONTRAST
TECHNIQUE: Multidetector CT imaging of the abdomen and pelvis was performed
using the standard protocol following bolus administration of
intravenous contrast.
CONTRAST:  100mL YIKM4W-AFF IOPAMIDOL (YIKM4W-AFF) INJECTION 61%

[Series 2: abd/pel with · axial · 0.74mm/px · z∈[+999,+1359]mm · 12 of 86 slices shown, 14 images]
[im 7/86  soft-tissue]
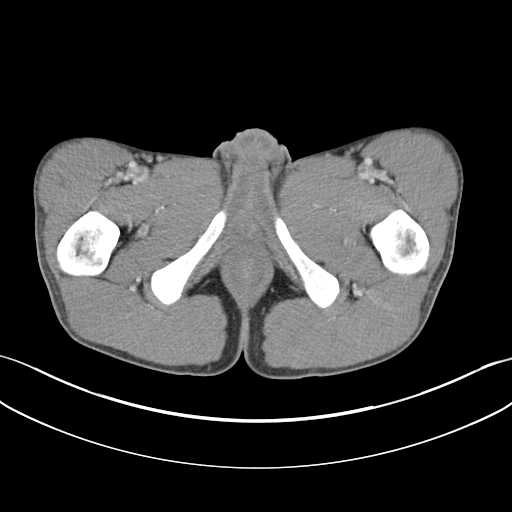
[im 7/86  bone]
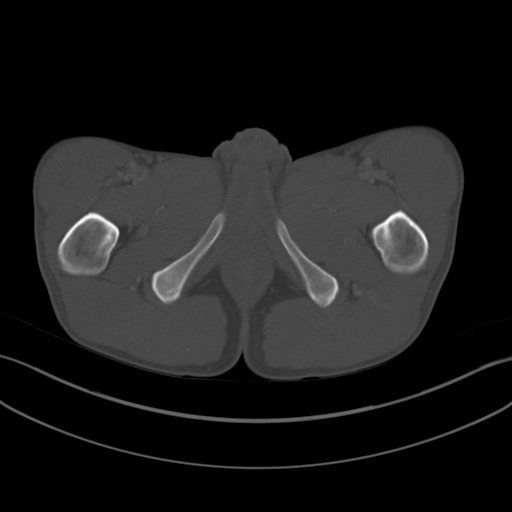
[im 13/86  soft-tissue]
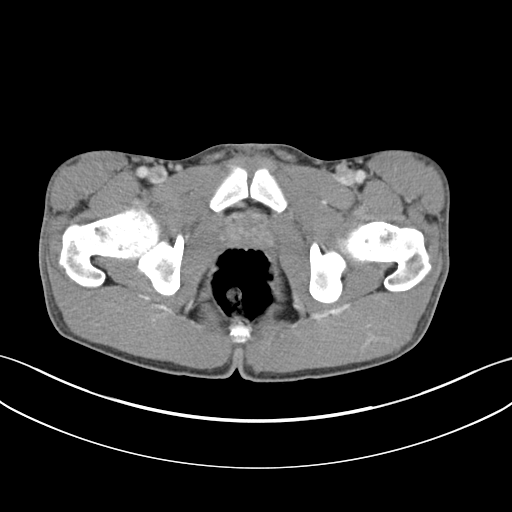
[im 19/86  soft-tissue]
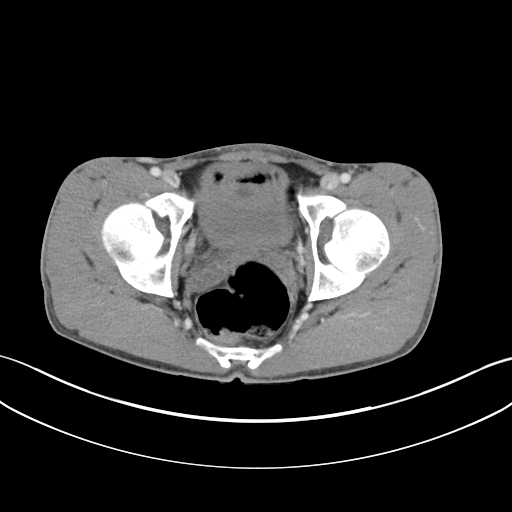
[im 25/86  soft-tissue]
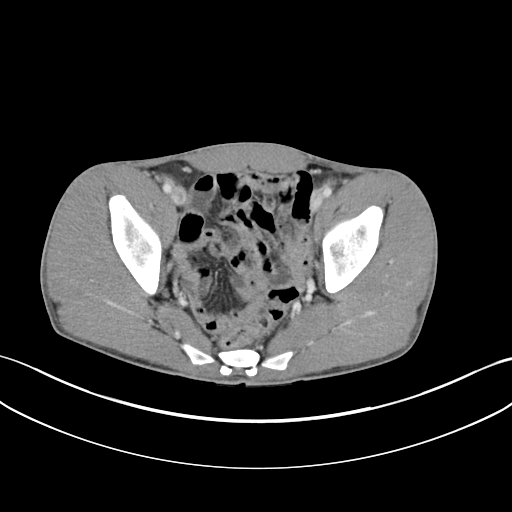
[im 31/86  soft-tissue]
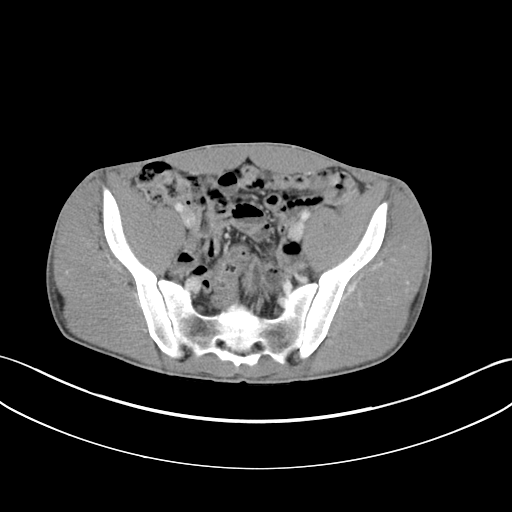
[im 37/86  soft-tissue]
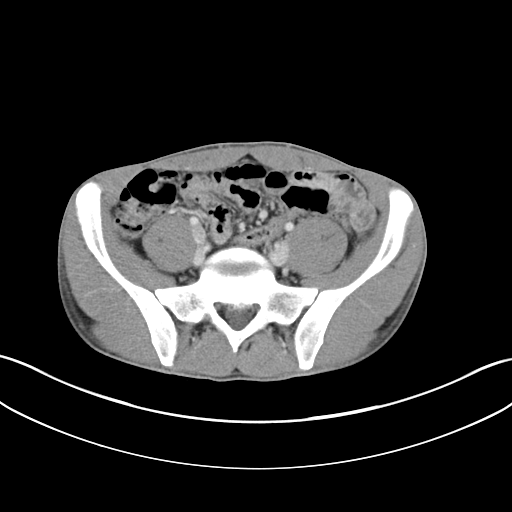
[im 49/86  soft-tissue]
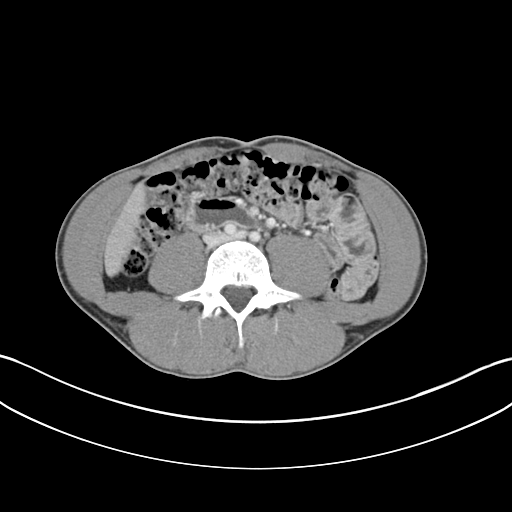
[im 55/86  soft-tissue]
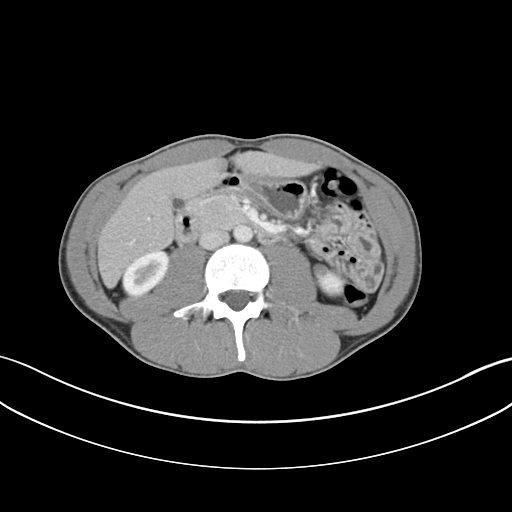
[im 61/86  soft-tissue]
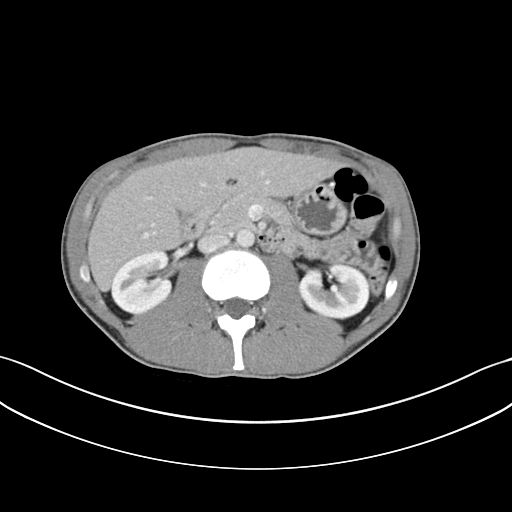
[im 61/86  bone]
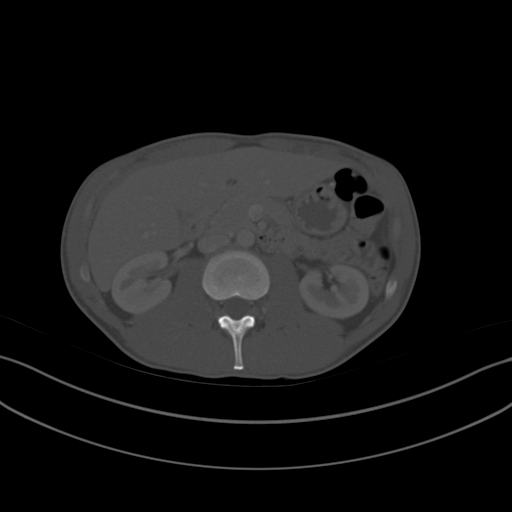
[im 67/86  soft-tissue]
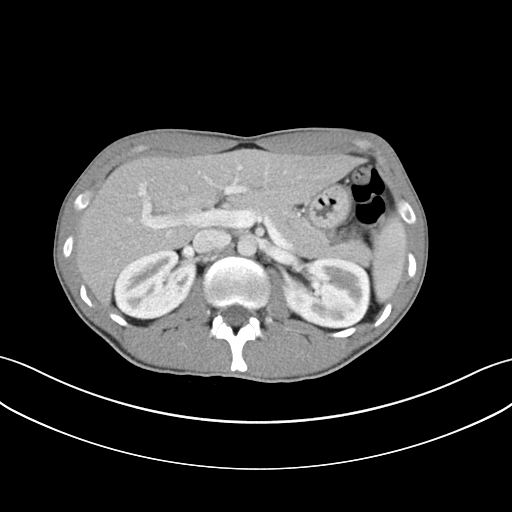
[im 73/86  soft-tissue]
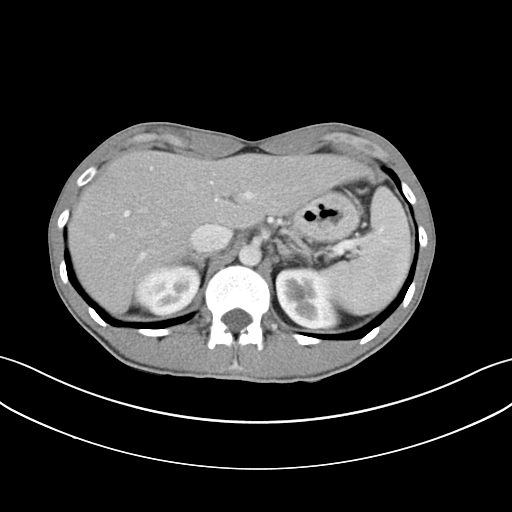
[im 79/86  soft-tissue]
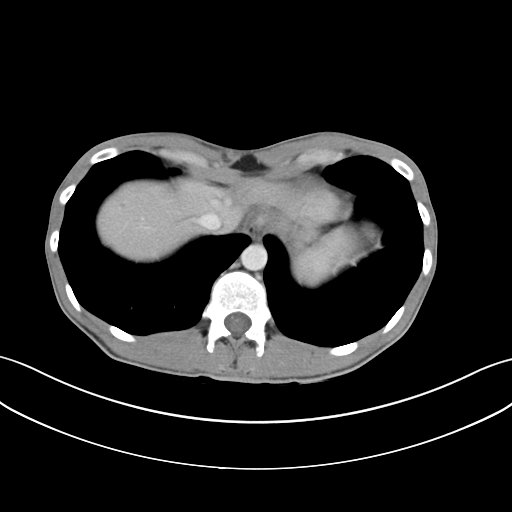

[Series 3: coronal a/|p · coronal · 0.65mm/px · 3 of 112 slices shown]
[im 38/112  soft-tissue]
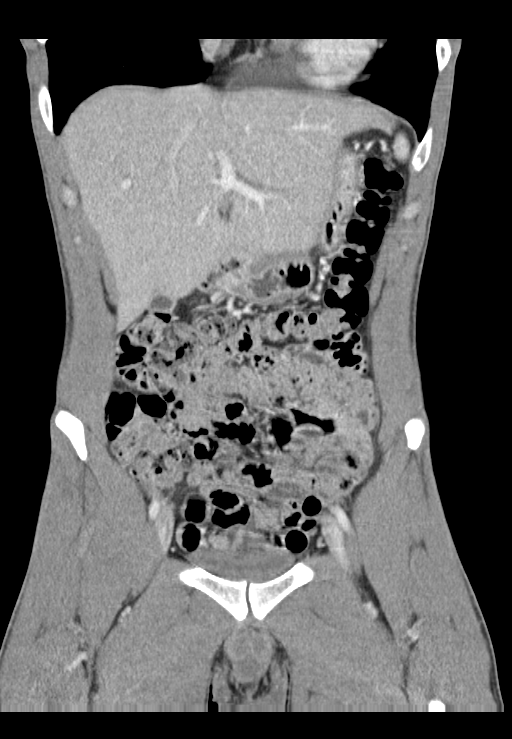
[im 50/112  soft-tissue]
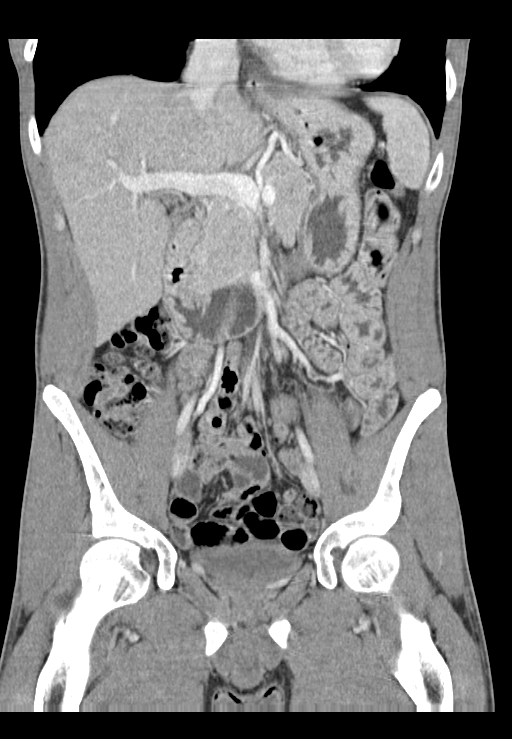
[im 62/112  soft-tissue]
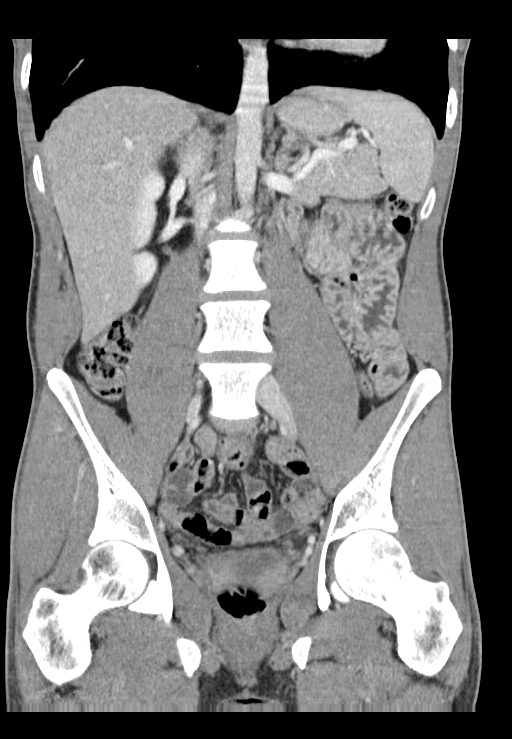

[15 of 46 positions shown; findings below may reference images not displayed]

FINDINGS: Lower chest: Pectus excavatum deformity.  Lung bases are clear.

Hepatobiliary: No focal liver abnormality is seen. No gallstones,
gallbladder wall thickening, or biliary dilatation.

Pancreas: Unremarkable. No pancreatic ductal dilatation or
surrounding inflammatory changes.

Spleen: Normal in size without focal abnormality.

Adrenals/Urinary Tract: Adrenal glands are unremarkable. Kidneys are
normal, without renal calculi, focal lesion, or hydronephrosis.
Bladder is unremarkable.

Stomach/Bowel: Stomach, small bowel, and colon are not abnormally
distended. Scattered fluid and gas throughout the small bowel.
Scattered stool throughout the colon. No discrete wall thickening is
appreciated although under distention limits this evaluation.

Vascular/Lymphatic: No significant vascular findings are present. No
enlarged abdominal or pelvic lymph nodes. Filling defects are
demonstrated in the common femoral and iliac veins bilaterally. This
may be due to mixing of non-opacified blood but can't exclude venous
thrombosis.

Reproductive: Uterus and bilateral adnexa are unremarkable.

Other: No abdominal wall hernia or abnormality. No abdominopelvic
ascites.

Musculoskeletal: No acute or significant osseous findings.
IMPRESSION: No definite evidence for any acute process in the abdomen or pelvis.
No evidence of bowel obstruction. Nonspecific filling defects in the
common femoral and iliac veins may be due to mixing of non-opacified
blood but can't exclude venous thrombosis.

## 2021-12-01 ENCOUNTER — Emergency Department (HOSPITAL_COMMUNITY): Payer: 59

## 2021-12-01 ENCOUNTER — Encounter (HOSPITAL_COMMUNITY): Payer: Self-pay | Admitting: *Deleted

## 2021-12-01 ENCOUNTER — Emergency Department (HOSPITAL_COMMUNITY)
Admission: EM | Admit: 2021-12-01 | Discharge: 2021-12-02 | Disposition: A | Discharge status: Home / Self Care | Payer: 59 | Attending: Emergency Medicine | Admitting: Emergency Medicine

## 2021-12-01 ENCOUNTER — Other Ambulatory Visit: Payer: Self-pay

## 2021-12-01 DIAGNOSIS — R000 Tachycardia, unspecified: Secondary | ICD-10-CM | POA: Insufficient documentation

## 2021-12-01 DIAGNOSIS — R079 Chest pain, unspecified: Secondary | ICD-10-CM | POA: Insufficient documentation

## 2021-12-01 DIAGNOSIS — R0789 Other chest pain: Secondary | ICD-10-CM | POA: Diagnosis not present

## 2021-12-01 DIAGNOSIS — I10 Essential (primary) hypertension: Secondary | ICD-10-CM | POA: Diagnosis not present

## 2021-12-01 DIAGNOSIS — R69 Illness, unspecified: Secondary | ICD-10-CM | POA: Diagnosis not present

## 2021-12-01 DIAGNOSIS — F1510 Other stimulant abuse, uncomplicated: Secondary | ICD-10-CM

## 2021-12-01 LAB — CBC WITH DIFFERENTIAL/PLATELET
Abs Immature Granulocytes: 0.02 10*3/uL (ref 0.00–0.07)
Basophils Absolute: 0.1 10*3/uL (ref 0.0–0.1)
Basophils Relative: 1 %
Eosinophils Absolute: 0.2 10*3/uL (ref 0.0–0.5)
Eosinophils Relative: 2 %
HCT: 44.7 % (ref 39.0–52.0)
Hemoglobin: 15.7 g/dL (ref 13.0–17.0)
Immature Granulocytes: 0 %
Lymphocytes Relative: 15 %
Lymphs Abs: 1.5 10*3/uL (ref 0.7–4.0)
MCH: 32.5 pg (ref 26.0–34.0)
MCHC: 35.1 g/dL (ref 30.0–36.0)
MCV: 92.5 fL (ref 80.0–100.0)
Monocytes Absolute: 0.6 10*3/uL (ref 0.1–1.0)
Monocytes Relative: 6 %
Neutro Abs: 7.7 10*3/uL (ref 1.7–7.7)
Neutrophils Relative %: 76 %
Platelets: 258 10*3/uL (ref 150–400)
RBC: 4.83 MIL/uL (ref 4.22–5.81)
RDW: 11.6 % (ref 11.5–15.5)
WBC: 10.1 10*3/uL (ref 4.0–10.5)
nRBC: 0 % (ref 0.0–0.2)

## 2021-12-01 LAB — I-STAT CHEM 8, ED
BUN: 19 mg/dL (ref 6–20)
Calcium, Ion: 1.14 mmol/L — ABNORMAL LOW (ref 1.15–1.40)
Chloride: 104 mmol/L (ref 98–111)
Creatinine, Ser: 1.1 mg/dL (ref 0.61–1.24)
Glucose, Bld: 87 mg/dL (ref 70–99)
HCT: 47 % (ref 39.0–52.0)
Hemoglobin: 16 g/dL (ref 13.0–17.0)
Potassium: 3.8 mmol/L (ref 3.5–5.1)
Sodium: 139 mmol/L (ref 135–145)
TCO2: 17 mmol/L — ABNORMAL LOW (ref 22–32)

## 2021-12-01 LAB — COMPREHENSIVE METABOLIC PANEL
ALT: 18 U/L (ref 0–44)
AST: 35 U/L (ref 15–41)
Albumin: 4.5 g/dL (ref 3.5–5.0)
Alkaline Phosphatase: 72 U/L (ref 38–126)
Anion gap: 14 (ref 5–15)
BUN: 20 mg/dL (ref 6–20)
CO2: 18 mmol/L — ABNORMAL LOW (ref 22–32)
Calcium: 9.6 mg/dL (ref 8.9–10.3)
Chloride: 105 mmol/L (ref 98–111)
Creatinine, Ser: 1.08 mg/dL (ref 0.61–1.24)
GFR, Estimated: >60 mL/min (ref >60)
Glucose, Bld: 91 mg/dL (ref 70–99)
Potassium: 3.8 mmol/L (ref 3.5–5.1)
Sodium: 137 mmol/L (ref 135–145)
Total Bilirubin: 3.1 mg/dL — ABNORMAL HIGH (ref 0.3–1.2)
Total Protein: 7.8 g/dL (ref 6.5–8.1)

## 2021-12-01 LAB — RAPID URINE DRUG SCREEN, HOSP PERFORMED
Amphetamines: POSITIVE — AB
Barbiturates: NOT DETECTED
Benzodiazepines: NOT DETECTED
Cocaine: NOT DETECTED
Opiates: NOT DETECTED
Tetrahydrocannabinol: POSITIVE — AB

## 2021-12-01 LAB — ETHANOL: Alcohol, Ethyl (B): <10 mg/dL (ref <10)

## 2021-12-01 LAB — CK: Total CK: 402 U/L — ABNORMAL HIGH (ref 49–397)

## 2021-12-01 MED ORDER — LACTATED RINGERS IV BOLUS
1000.0000 mL | Freq: Once | INTRAVENOUS | Status: AC
  Administered 2021-12-01: 1000 mL via INTRAVENOUS

## 2021-12-01 MED ORDER — LORAZEPAM 2 MG/ML IJ SOLN
1.0000 mg | Freq: Once | INTRAMUSCULAR | Status: AC
  Administered 2021-12-01: 1 mg via INTRAVENOUS
  Filled 2021-12-01: qty 1
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
## 2021-12-01 NOTE — ED Notes (Addendum)
Pt continues to yell and refusing vitals.

## 2021-12-01 NOTE — ED Notes (Signed)
Pt refusing an EKG. States " Am I a joke to you?! All these stickers for no reason!".

## 2021-12-01 NOTE — ED Notes (Signed)
Pt refuses VS at this time, pt refuses to give urine sample at this time.
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
## 2021-12-02 NOTE — Discharge Instructions (Addendum)
Apply ice to sore areas.  Ice to be applied for 30 minutes at a time, 4 times a day.  You may take ibuprofen or naproxen as needed for pain.  For additional pain relief, add acetaminophen.  Please be aware that when you combine acetaminophen with either ibuprofen or naproxen, you get better pain relief and you get from taking either medication by itself.
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
## 2021-12-02 NOTE — ED Notes (Signed)
AVS with prescriptions provided to and discussed with patient. Pt verbalizes understanding of discharge instructions and denies any questions or concerns at this time. Pt ambulated out of department independently with steady gait. ? ?

## 2023-08-20 ENCOUNTER — Other Ambulatory Visit: Payer: Self-pay

## 2023-08-20 ENCOUNTER — Emergency Department (HOSPITAL_COMMUNITY): Payer: Self-pay

## 2023-08-20 ENCOUNTER — Ambulatory Visit (HOSPITAL_COMMUNITY)
Admission: EM | Admit: 2023-08-20 | Discharge: 2023-08-20 | Disposition: A | Discharge status: Home / Self Care | Payer: Self-pay | Attending: Family Medicine | Admitting: Family Medicine

## 2023-08-20 ENCOUNTER — Emergency Department (HOSPITAL_COMMUNITY)
Admission: EM | Admit: 2023-08-20 | Discharge: 2023-08-21 | Disposition: A | Discharge status: Home / Self Care | Payer: Self-pay | Attending: Emergency Medicine | Admitting: Emergency Medicine

## 2023-08-20 ENCOUNTER — Encounter (HOSPITAL_COMMUNITY): Payer: Self-pay | Admitting: Emergency Medicine

## 2023-08-20 DIAGNOSIS — R079 Chest pain, unspecified: Secondary | ICD-10-CM | POA: Insufficient documentation

## 2023-08-20 DIAGNOSIS — R42 Dizziness and giddiness: Secondary | ICD-10-CM

## 2023-08-20 DIAGNOSIS — R0789 Other chest pain: Secondary | ICD-10-CM

## 2023-08-20 DIAGNOSIS — Z5900 Homelessness unspecified: Secondary | ICD-10-CM | POA: Insufficient documentation

## 2023-08-20 DIAGNOSIS — H538 Other visual disturbances: Secondary | ICD-10-CM | POA: Insufficient documentation

## 2023-08-20 HISTORY — DX: Pectus excavatum: Q67.6

## 2023-08-20 LAB — BASIC METABOLIC PANEL WITH GFR
Anion gap: 16 — ABNORMAL HIGH (ref 5–15)
BUN: 29 mg/dL — ABNORMAL HIGH (ref 6–20)
CO2: 23 mmol/L (ref 22–32)
Calcium: 9.6 mg/dL (ref 8.9–10.3)
Chloride: 100 mmol/L (ref 98–111)
Creatinine, Ser: 1.06 mg/dL (ref 0.61–1.24)
GFR, Estimated: >60 mL/min (ref >60)
Glucose, Bld: 114 mg/dL — ABNORMAL HIGH (ref 70–99)
Potassium: 3.9 mmol/L (ref 3.5–5.1)
Sodium: 139 mmol/L (ref 135–145)

## 2023-08-20 LAB — CBC
HCT: 44.9 % (ref 39.0–52.0)
Hemoglobin: 15 g/dL (ref 13.0–17.0)
MCH: 31.1 pg (ref 26.0–34.0)
MCHC: 33.4 g/dL (ref 30.0–36.0)
MCV: 93.2 fL (ref 80.0–100.0)
Platelets: 275 10*3/uL (ref 150–400)
RBC: 4.82 MIL/uL (ref 4.22–5.81)
RDW: 12.1 % (ref 11.5–15.5)
WBC: 7.6 10*3/uL (ref 4.0–10.5)
nRBC: 0 % (ref 0.0–0.2)

## 2023-08-20 LAB — TROPONIN I (HIGH SENSITIVITY): Troponin I (High Sensitivity): 4 ng/L (ref <18)

## 2023-08-20 MED ORDER — ASPIRIN 81 MG PO CHEW: 324.0000 mg | CHEWABLE_TABLET | Freq: Once | ORAL | Status: DC

## 2023-08-20 NOTE — ED Provider Triage Note (Signed)
 Emergency Medicine Provider Triage Evaluation Note  Mathew Byrd , a 43 y.o. male  was evaluated in triage.  Pt complains of left-sided chest pain that radiates to the left arm.  He complains of persistent discomfort to the left side of his chest but states that today this is exacerbated and that he feels that it is greatly increased to his baseline.  He was previously seen at urgent care today for the same, endorsed at that time that he had increased rest due to personal issues and recent death in the family.  Also endorses homelessness and past history of substance abuse.  He has also had previous workup at outlying facility for same type symptoms.  Review of Systems  Positive: As above Negative:   Physical Exam  BP (!) 137/95 (BP Location: Right Arm)   Pulse 98   Temp 98.1 F (36.7 C) (Oral)   Resp 16   Wt 71 kg   BMI 20.65 kg/m  Gen:   Awake, no distress   Resp:  Normal effort  MSK:   Moves extremities without difficulty  Other:  Left side of the chest wall is moderately tender to palpation.  Medical Decision Making  Medically screening exam initiated at 9:30 PM.  Appropriate orders placed.  Mathew Byrd was informed that the remainder of the evaluation will be completed by another provider, this initial triage assessment does not replace that evaluation, and the importance of remaining in the ED until their evaluation is complete.  Believing to be consistent with possible costochondritis, anxiety related pain, however cannot rule out ACS at this time.  Chest pain orders as noted.   Mathew Byrd, Mathew Byrd 08/20/23 2133

## 2023-08-20 NOTE — ED Triage Notes (Addendum)
 Pt having chest pressure, left side pain, dizziness that has been going on for 9 years but got worse in past year. Reports going through divorce and grandmother recently died. Pt reports he is currently homeless and pain got worse earlier when he was downtown.

## 2023-08-20 NOTE — Discharge Instructions (Signed)
  1. Atypical chest pain (Primary) 2. Dizziness - EKG 12-Lead performed in UC shows normal sinus rhythm with biatrial enlargement, rightward axis and nonspecific ST abnormality.  Ventricular rate of 91 bpm, abnormal EKG. - Based on reported chest pain, left-sided pain, dizziness with increased stress recommend follow-up in ER for further evaluation and management. - Please go directly to [facility] emergency room for further evaluation and treatment with stat laboratory testing and advanced imaging to determine cause of symptoms.

## 2023-08-20 NOTE — ED Notes (Signed)
 Patient is being discharged from the Urgent Care and sent to the Emergency Department via POV. Per Hosey Aguas, NP, patient is in need of higher level of care due to chest pain. Patient is aware and verbalizes understanding of plan of care.  Vitals:   08/20/23 1852  BP: (!) 149/105  Pulse: 92  Resp: 15  Temp: 98.1 F (36.7 C)  SpO2: 96%

## 2023-08-20 NOTE — ED Provider Notes (Signed)
 [facility] [place]  Note:  This document was prepared using Dragon voice recognition software and may include unintentional dictation errors.  MRN: [id number] DOB: February 19, 1981  Subjective:   Mathew Byrd is a 43 y.o. male presenting for chest pressure, left side pain, dizziness has been ongoing off and on for 9 years but has been worse in the last year.  Patient reports increased stress while going through divorce and the death of his grandmother recently.  Patient is currently homeless and has past history of substance abuse.  Patient reports that earlier when he was in downtown [place] his symptoms became worse.  Patient denies any current chest pain, shortness of breath, weakness, dizziness.  Patient states that he was evaluated last week at [facility] for similar symptoms which were inconclusive.  No current facility-administered medications for this encounter.  Current Outpatient Medications:    omeprazole  (PRILOSEC) 20 MG capsule, Take 1 capsule (20 mg total) by mouth daily., Disp: 30 capsule, Rfl: 0   No Known Allergies  Past Medical History:  Diagnosis Date   Pectus excavatum    Renal infection      History reviewed. No pertinent surgical history.  No family history on file.  Social History   Tobacco Use   Smoking status: Every Day    Current packs/day: 0.50    Types: Cigarettes  Substance Use Topics   Alcohol use: Yes    Comment: rare   Drug use: No    ROS Refer to HPI for ROS details.  Objective:   Vitals: BP (!) 149/105 (BP Location: Right Arm)   Pulse 92   Temp 98.1 F (36.7 C) (Oral)   Resp 15   SpO2 96%   Physical Exam Vitals and nursing note reviewed.  Constitutional:      General: He is not in acute distress.    Appearance: Normal appearance. He is well-developed. He is not ill-appearing, toxic-appearing or diaphoretic.  HENT:     Head: Normocephalic.   Cardiovascular:     Rate and Rhythm: Normal rate and regular rhythm.     Heart  sounds: Normal heart sounds. No murmur heard. Pulmonary:     Effort: Pulmonary effort is normal. No respiratory distress.     Breath sounds: Normal breath sounds. No stridor. No wheezing, rhonchi or rales.  Chest:     Chest wall: No tenderness.   Skin:    General: Skin is warm and dry.   Neurological:     General: No focal deficit present.     Mental Status: He is alert and oriented to person, place, and time.   Psychiatric:        Mood and Affect: Mood normal.        Behavior: Behavior normal.     Procedures  No results found for this or any previous visit (from the past 24 hours).  No results found.   Assessment and Plan :     Discharge Instructions       1. Atypical chest pain (Primary) 2. Dizziness - EKG 12-Lead performed in UC shows normal sinus rhythm with biatrial enlargement, rightward axis and nonspecific ST abnormality.  Ventricular rate of 91 bpm, abnormal EKG. - Based on reported chest pain, left-sided pain, dizziness with increased stress recommend follow-up in ER for further evaluation and management. - Please go directly to [facility] emergency room for further evaluation and treatment with stat laboratory testing and advanced imaging to determine cause of symptoms.      Itzayana Pardy  KATHEE Aurea Aurea, [place] B, NP 08/20/23 1920

## 2023-08-20 NOTE — ED Triage Notes (Addendum)
 Patient c/o chest pressure, sob, left flank pain, blurred vision and left arm tingling x 9 years, worse x 2-2 months.  Patient requesting xanax because it helps calm him down.  Patient gives verbal consent for MSE.

## 2023-08-21 LAB — TROPONIN I (HIGH SENSITIVITY): Troponin I (High Sensitivity): 4 ng/L (ref <18)

## 2023-08-21 MED ORDER — ALPRAZOLAM 0.25 MG PO TABS
0.5000 mg | ORAL_TABLET | Freq: Once | ORAL | Status: AC
  Administered 2023-08-21: 0.5 mg via ORAL
  Filled 2023-08-21: qty 2

## 2023-08-21 NOTE — Discharge Instructions (Addendum)
 You were seen today for chest pain.  Suspect this might be likely muscle strain related as pain was reproducible on exam, and also having very reassuring labs and imaging at this time.  Recommend that you continue to follow-up with PCP for further evaluation and establishment of care and go to behavioral health urgent care if anxiety is worsening.  You can use Tylenol  and ibuprofen for pain management.  Take Tylenol  (acetominophen)  650mg  every 4-6 hours, as needed for pain or fever. Do not take more than 4,000 mg in a 24-hour period. As this may cause liver damage. While this is rare, if you begin to develop yellowing of the skin or eyes, stop taking and return to ER immediately.  Take Ibuprofen 400mg  every 4-6 hours for pain or fever, not exceeding 3,200 mg per day as more than 3,200mg  can cause Stomach irritation, dizziness, kidney issues with long-term use.  Please return to the ED if you need to have any new or worsening symptoms which would include chest pain accompanied with shortness of breath on exertion, vision changes, one-sided weakness, uncontrollable pain.

## 2023-08-21 NOTE — ED Provider Notes (Addendum)
 [place] EMERGENCY DEPARTMENT AT [facility] Provider Note   CSN: [id number] Arrival date & time: 08/20/23  8082     Patient presents with: Chest Pain and Blurred Vision   Mathew Byrd is a 43 y.o. male.   Chest Pain Patient is a 43 year old male presents the ED today with complaints of left-sided chest pressure and left flank pressure that has been intermittent over 9 years.  States that is been worse last 3 months due to having increased stress while going through divorce and recent death of his grandmother.  Currently homeless and has a past history of substance abuse of marijuana.  Noted to have an abnormal EKG at urgent care and was sent to the ED today.  Currently symptoms have abated and currently asymptomatic.  Denies fever, headache, vision changes, exertional shortness of breath, abdominal pain, nausea, vomiting, diarrhea, lower leg swelling.     Prior to Admission medications   Medication Sig Start Date End Date Taking? Authorizing Provider  omeprazole  (PRILOSEC) 20 MG capsule Take 1 capsule (20 mg total) by mouth daily. 11/23/15   Keith Sor, PA-C    Allergies: Patient has no known allergies.    Review of Systems  Cardiovascular:  Positive for chest pain.  All other systems reviewed and are negative.   Updated Vital Signs BP (!) 136/95   Pulse 82   Temp 98 F (36.7 C) (Oral)   Resp 19   Wt 71 kg   SpO2 100%   BMI 20.65 kg/m   Physical Exam Vitals and nursing note reviewed.  Constitutional:      General: He is not in acute distress.    Appearance: Normal appearance. He is not ill-appearing or diaphoretic.  HENT:     Head: Normocephalic and atraumatic.   Eyes:     General:        Right eye: No discharge.        Left eye: No discharge.     Extraocular Movements: Extraocular movements intact.     Conjunctiva/sclera: Conjunctivae normal.    Cardiovascular:     Rate and Rhythm: Normal rate and regular rhythm.     Pulses: Normal  pulses.     Heart sounds: Normal heart sounds. No murmur heard.    No friction rub. No gallop.  Pulmonary:     Effort: Pulmonary effort is normal. No respiratory distress.     Breath sounds: Normal breath sounds. No stridor. No wheezing, rhonchi or rales.  Chest:     Chest wall: Tenderness (Distal tenderness noted to the left side of his chest across the pectoris muscle as well as tenderness noted to the latissimus dorsi muscle on the left side.) present.  Abdominal:     General: Abdomen is flat. There is no distension.     Palpations: Abdomen is soft.     Tenderness: There is no abdominal tenderness. There is no right CVA tenderness, left CVA tenderness or guarding.   Musculoskeletal:     Cervical back: Normal range of motion. No rigidity.     Right lower leg: No edema.     Left lower leg: No edema.   Skin:    General: Skin is warm and dry.     Findings: No ecchymosis or rash.   Neurological:     General: No focal deficit present.     Mental Status: He is alert and oriented to person, place, and time. Mental status is at baseline.     Sensory: No  sensory deficit.     Motor: No weakness.     Coordination: Coordination normal.     Gait: Gait normal.   Psychiatric:        Mood and Affect: Mood normal.     (all labs ordered are listed, but only abnormal results are displayed) Labs Reviewed  BASIC METABOLIC PANEL WITH GFR - Abnormal; Notable for the following components:      Result Value   Glucose, Bld 114 (*)    BUN 29 (*)    Anion gap 16 (*)    All other components within normal limits  CBC  CBG MONITORING, ED  TROPONIN I (HIGH SENSITIVITY)  TROPONIN I (HIGH SENSITIVITY)    EKG: None  Radiology: DG Chest 1 View Result Date: 08/20/2023 CLINICAL DATA:  Chest pain. EXAM: CHEST  1 VIEW COMPARISON:  December 01, 2021 FINDINGS: The heart size and mediastinal contours are within normal limits. Both lungs are clear. The visualized skeletal structures are unremarkable.  IMPRESSION: No active disease. Electronically Signed   By: Suzen Dials M.D.   On: 08/20/2023 21:48    Procedures   Medications Ordered in the ED  aspirin chewable tablet 324 mg (324 mg Oral Not Given 08/21/23 0210)  ALPRAZolam (XANAX) tablet 0.5 mg (0.5 mg Oral Given 08/21/23 0243)             HEART Score: 1                    Medical Decision Making Amount and/or Complexity of Data Reviewed ECG/medicine tests: ordered.  Risk Prescription drug management.   This patient is a 43 year old male who presents to the ED for concern of left-sided chest pressure and intermittent shortness of breath going on 9 years intermittently, worse recently as patient notes that this is increases with increased stress noting that his grandmother recently passed away and is going through a divorce and currently homeless.  Seen by urgent care today was noted to have nonspecific ST. notes that he has recently been working out and doing pull-ups and push-ups more.  Denies drug use.  On physical exam, patient is in no acute distress, afebrile, alert and orient x 4, speaking in full sentences, nontachypneic, nontachycardic.  LCTAB, RRR, no murmur. exam is notable for some muscle tenderness over the pectoris muscles on the left side as well as the latissimus dorsi muscles on the left side.  No lower leg edema.  Exam is otherwise unremarkable.  Labs did note a mild anion gap, patient notes that he has not eaten all day today, suspect likely starvation as the cause for the slightly elevated anion gap.  Labs were otherwise unremarkable and imaging was also unremarkable. Provide patient with Xanax per his request.  Due to him saying this has helped in the past.  Will have him follow-up with behavioral urgent care for any anxiety needs before he follows with PCP.  Will have him follow-up with PCP.  Patient vital signs have remained stable throughout the course of patient's time in the ED. Low suspicion for any other  emergent pathology at this time. I believe this patient is safe to be discharged. Provided strict return to ER precautions. Patient expressed agreement and understanding of plan. All questions were answered.  Differential diagnoses prior to evaluation: The emergent differential diagnosis includes, but is not limited to, ACS, PE, anxiety, costochondritis, bronchitis, pneumonia, muscle strain, Boerhaave's, nephrolithiasis. This is not an exhaustive differential.   Past Medical History / Co-morbidities /  Social History: Renal infection  Additional history: Chart reviewed. Pertinent results include:   Seen by urgent care today, noted to have been having left-sided chest pain accompanied with dizziness has been off and on for the last 9 years but worse with this last year.  Noted to have been going through divorce and recent death of his grandmother, currently homeless and has history of substance abuse.  Reported to have been seen by [facility] earlier last week and noted that the studies were inconclusive.  Lab Tests/Imaging studies: I personally interpreted labs/imaging and the pertinent results include:   CBC unremarkable BMP does show an elevated BUN of 29 as well as a mild increase anion gap of 16 Troponin unremarkable Chest x-ray unremarkable I agree with the radiologist interpretation.   Medications: I ordered medication including Xanax.  I have reviewed the patients home medicines and have made adjustments as needed.  Critical Interventions: None  Social Determinants of Health: Noted to be recently under more stress with family, noted to be homeless and has a past history of substance abuse.  No PCP.  Disposition: After consideration of the diagnostic results and the patients response to treatment, I feel that the patient would benefit from discharge and treatment as above.   emergency department workup does not suggest an emergent condition requiring admission or immediate intervention  beyond what has been performed at this time. The plan is: Establish care with PCP, go to behavioral urgent care as needed for anxiety if worsening, return to the ED for any new or worsening symptoms. The patient is safe for discharge and has been instructed to return immediately for worsening symptoms, change in symptoms or any other concerns.   Final diagnoses:  Chest pain, unspecified type    ED Discharge Orders     None          Beola Terrall RAMAN, PA-C 08/21/23 0248    Beola Terrall RAMAN, PA-C 08/21/23 0408    Jerrol Agent, MD 08/21/23 [phone]

## 2023-08-27 ENCOUNTER — Encounter: Payer: Self-pay | Admitting: *Deleted

## 2023-08-27 NOTE — Congregational Nurse Program (Signed)
  Dept: [phone]   Congregational Nurse Program Note  Date of Encounter: 08/27/2023  Past Medical History: Past Medical History:  Diagnosis Date   Pectus excavatum    Renal infection     Encounter Details:  Community Questionnaire - 08/27/23 1222       Questionnaire   Ask client: Do you give verbal consent for me to treat you today? Yes    Student Assistance N/A    Location Patient Served  [facility]    Encounter Setting CN site    Population Status Unhoused    Insurance Unknown    Insurance/Financial Assistance Referral N/A    Medication N/A    Medical Provider No    Screening Referrals Made N/A    Medical Referrals Made [facility] PCP[facility]inic    Medical Appointment Completed N/A    CNP Interventions Advocate/Support;Navigate Healthcare System    Screenings CN Performed Blood Pressure;Weight    ED Visit Averted N/A    Life-Saving Intervention Made N/A         Client came to nurse's office c/o hurting feet from walking. Client's feet were damp, unclean and he was wearing soft, cloth shoes with no support. Instructed client to clean feet with soap and water as he was being admitted to shelter today. Instructed to pat dry and cover with bandages along with small triple antibiotic ointment packets. These items were given to client. He is to return Wednesday at 1:00 to [facility] clinic for recheck and apply for medical clinic tennis shoes. He was given two new pair of sock and [facility] staff were looking for shoes in their shoe supply area. Completed triage form. Blood pressure 111/70, pulse 90, temperature 99.1 F (37.3 C), temperature source Oral, height 6' 1 (1.854 m), SpO2 91%.  Mathew Byrd W RN CN
